# Patient Record
Sex: Female | Born: 1935 | Race: White | Hispanic: No | Marital: Married | Smoking: Never smoker
Health system: Southern US, Community
[De-identification: ages and names within clinical notes are randomized; demographics above are authoritative.]

## PROBLEM LIST (undated history)

## (undated) DIAGNOSIS — E079 Disorder of thyroid, unspecified: Secondary | ICD-10-CM

## (undated) DIAGNOSIS — E785 Hyperlipidemia, unspecified: Secondary | ICD-10-CM

## (undated) DIAGNOSIS — I10 Essential (primary) hypertension: Secondary | ICD-10-CM

## (undated) DIAGNOSIS — K219 Gastro-esophageal reflux disease without esophagitis: Secondary | ICD-10-CM

## (undated) DIAGNOSIS — G2581 Restless legs syndrome: Secondary | ICD-10-CM

## (undated) DIAGNOSIS — E876 Hypokalemia: Secondary | ICD-10-CM

## (undated) HISTORY — PX: ABDOMINAL HYSTERECTOMY: SHX81

## (undated) HISTORY — PX: ANKLE FRACTURE SURGERY: SHX122

## (undated) HISTORY — PX: CHOLECYSTECTOMY: SHX55

## (undated) HISTORY — PX: APPENDECTOMY (OPEN): SHX54

## (undated) HISTORY — PX: OTHER SURGICAL HISTORY: SHX169

## (undated) HISTORY — DX: Disorder of thyroid, unspecified: E07.9

## (undated) HISTORY — PX: ANKLE SURGERY: SHX546

## (undated) HISTORY — PX: SALPINGO OOPHORECTOMY: SHX510126

## (undated) HISTORY — DX: Essential (primary) hypertension: I10

## (undated) HISTORY — PX: HYSTERECTOMY: SHX81

## (undated) HISTORY — DX: Hyperlipidemia, unspecified: E78.5

## (undated) HISTORY — PX: CORRECTION HAMMER TOE: SUR317

## (undated) HISTORY — PX: JOINT REPLACEMENT: SHX530

---

## 2006-05-03 DIAGNOSIS — M19019 Primary osteoarthritis, unspecified shoulder: Secondary | ICD-10-CM | POA: Insufficient documentation

## 2007-05-31 ENCOUNTER — Ambulatory Visit
Admission: RE | Admit: 2007-05-31 | Disposition: A | Discharge status: Home / Self Care | Payer: Self-pay | Source: Ambulatory Visit

## 2013-01-08 ENCOUNTER — Other Ambulatory Visit: Payer: Self-pay | Admitting: Family Medicine

## 2013-01-08 ENCOUNTER — Ambulatory Visit
Admission: RE | Admit: 2013-01-08 | Discharge: 2013-01-08 | Disposition: A | Discharge status: Home / Self Care | Payer: Medicare Other | Source: Ambulatory Visit | Attending: Family Medicine | Admitting: Family Medicine

## 2013-01-08 DIAGNOSIS — M79609 Pain in unspecified limb: Secondary | ICD-10-CM | POA: Insufficient documentation

## 2013-04-17 ENCOUNTER — Other Ambulatory Visit (INDEPENDENT_AMBULATORY_CARE_PROVIDER_SITE_OTHER): Payer: Self-pay | Admitting: Family

## 2013-04-23 ENCOUNTER — Other Ambulatory Visit: Payer: Self-pay | Admitting: Internal Medicine

## 2013-04-24 ENCOUNTER — Other Ambulatory Visit: Payer: Self-pay | Admitting: Internal Medicine

## 2013-06-17 ENCOUNTER — Other Ambulatory Visit: Payer: Self-pay | Admitting: Internal Medicine

## 2013-07-11 ENCOUNTER — Other Ambulatory Visit (INDEPENDENT_AMBULATORY_CARE_PROVIDER_SITE_OTHER): Payer: Self-pay | Admitting: Family

## 2013-07-11 MED ORDER — CLONAZEPAM 1 MG PO TABS
1.0000 mg | ORAL_TABLET | Freq: Every evening | ORAL | Status: DC | PRN
Start: 2013-07-11 — End: 2013-12-29

## 2013-07-11 NOTE — Telephone Encounter (Signed)
Patient called and her mail away RX for Clonazepam will not be here until sometime next week.  She was prescribed this by Elnita Maxwell and Dr. Amada Jupiter for restless leg syndrome.  Since she is out and her mail order is late she is asking for an rx to be written and she will pick up.  Clonazepam 1 mgs.  She is also requesting that someone call her when this is ready.

## 2013-07-11 NOTE — Telephone Encounter (Signed)
Pt picked up Rx.. pkp

## 2013-07-11 NOTE — Telephone Encounter (Signed)
See below and order rx and reply to pool//jlm

## 2013-07-11 NOTE — Telephone Encounter (Signed)
RX ready

## 2013-08-11 ENCOUNTER — Other Ambulatory Visit (INDEPENDENT_AMBULATORY_CARE_PROVIDER_SITE_OTHER): Payer: Self-pay | Admitting: Family

## 2013-08-22 ENCOUNTER — Other Ambulatory Visit (INDEPENDENT_AMBULATORY_CARE_PROVIDER_SITE_OTHER): Payer: Self-pay | Admitting: Family

## 2013-10-17 ENCOUNTER — Other Ambulatory Visit (INDEPENDENT_AMBULATORY_CARE_PROVIDER_SITE_OTHER): Payer: Self-pay

## 2013-10-20 MED ORDER — VENLAFAXINE HCL ER 75 MG PO CP24
ORAL_CAPSULE | ORAL | Status: DC
Start: 2013-10-20 — End: 2014-09-02

## 2013-10-29 ENCOUNTER — Ambulatory Visit (INDEPENDENT_AMBULATORY_CARE_PROVIDER_SITE_OTHER): Payer: Medicare Other | Admitting: Family

## 2013-10-29 ENCOUNTER — Other Ambulatory Visit
Admission: RE | Admit: 2013-10-29 | Discharge: 2013-10-29 | Disposition: A | Discharge status: Home / Self Care | Payer: PRIVATE HEALTH INSURANCE | Source: Ambulatory Visit | Attending: Family | Admitting: Family

## 2013-10-29 ENCOUNTER — Encounter (INDEPENDENT_AMBULATORY_CARE_PROVIDER_SITE_OTHER): Payer: Self-pay | Admitting: Family

## 2013-10-29 VITALS — BP 131/71 | HR 85 | Temp 98.7°F | Ht 63.5 in | Wt 164.6 lb

## 2013-10-29 DIAGNOSIS — E559 Vitamin D deficiency, unspecified: Secondary | ICD-10-CM

## 2013-10-29 DIAGNOSIS — E039 Hypothyroidism, unspecified: Secondary | ICD-10-CM

## 2013-10-29 DIAGNOSIS — IMO0002 Reserved for concepts with insufficient information to code with codable children: Secondary | ICD-10-CM

## 2013-10-29 DIAGNOSIS — F3340 Major depressive disorder, recurrent, in remission, unspecified: Secondary | ICD-10-CM | POA: Insufficient documentation

## 2013-10-29 DIAGNOSIS — B351 Tinea unguium: Secondary | ICD-10-CM

## 2013-10-29 DIAGNOSIS — E785 Hyperlipidemia, unspecified: Secondary | ICD-10-CM

## 2013-10-29 DIAGNOSIS — L309 Dermatitis, unspecified: Secondary | ICD-10-CM

## 2013-10-29 DIAGNOSIS — L259 Unspecified contact dermatitis, unspecified cause: Secondary | ICD-10-CM

## 2013-10-29 LAB — LIPID PANEL
Cholesterol: 157 mg/dL (ref 75–199)
Coronary Heart Disease Risk: 3.93
HDL: 40 mg/dL — ABNORMAL LOW (ref 45–65)
LDL Calculated: 97 mg/dL
Triglycerides: 102 mg/dL (ref 10–150)
VLDL: 20 (ref 0–40)

## 2013-10-29 LAB — VITAMIN D,25 OH,TOTAL: Vitamin D 25-Hydroxy: 35 ng/mL (ref 30–80)

## 2013-10-29 LAB — CBC AND DIFFERENTIAL
Basophils %: 1 % (ref 0.0–3.0)
Basophils Absolute: 0.1 10*3/uL (ref 0.0–0.3)
Eosinophils %: 2.8 % (ref 0.0–7.0)
Eosinophils Absolute: 0.2 10*3/uL (ref 0.0–0.8)
Hematocrit: 37.9 % (ref 36.0–48.0)
Hemoglobin: 12.7 gm/dL (ref 12.0–16.0)
Lymphocytes Absolute: 1.1 10*3/uL (ref 0.6–5.1)
Lymphocytes: 20.1 % (ref 15.0–46.0)
MCH: 30 pg (ref 28–35)
MCHC: 34 gm/dL (ref 32–36)
MCV: 90 fL (ref 80–100)
MPV: 7.3 fL (ref 6.0–10.0)
Monocytes Absolute: 0.4 10*3/uL (ref 0.1–1.7)
Monocytes: 7.6 % (ref 3.0–15.0)
Neutrophils %: 68.5 % (ref 42.0–78.0)
Neutrophils Absolute: 3.8 10*3/uL (ref 1.7–8.6)
PLT CT: 299 10*3/uL (ref 130–440)
RBC: 4.22 10*6/uL (ref 3.80–5.00)
RDW: 12.5 % (ref 11.0–14.0)
WBC: 5.5 10*3/uL (ref 4.0–11.0)

## 2013-10-29 LAB — COMPREHENSIVE METABOLIC PANEL
ALT: 16 U/L (ref 0–55)
AST (SGOT): 23 U/L (ref 10–42)
Albumin/Globulin Ratio: 1.25 Ratio (ref 0.70–1.50)
Albumin: 4 gm/dL (ref 3.5–5.0)
Alkaline Phosphatase: 43 U/L (ref 40–145)
Anion Gap: 16.3 mMol/L (ref 7.0–18.0)
BUN / Creatinine Ratio: 20 Ratio (ref 10.0–30.0)
BUN: 22 mg/dL (ref 7–22)
Bilirubin, Total: 0.3 mg/dL (ref 0.1–1.2)
CO2: 23.6 mMol/L (ref 20.0–30.0)
Calcium: 9.8 mg/dL (ref 8.5–10.5)
Chloride: 105 mMol/L (ref 98–110)
Creatinine: 1.1 mg/dL (ref 0.60–1.20)
EGFR: 48 mL/min/{1.73_m2}
Globulin: 3.2 gm/dL (ref 2.0–4.0)
Glucose: 96 mg/dL (ref 70–99)
Osmolality Calc: 284 mOsm/kg (ref 275–300)
Potassium: 3.9 mMol/L (ref 3.5–5.3)
Protein, Total: 7.2 gm/dL (ref 6.0–8.3)
Sodium: 141 mMol/L (ref 136–147)

## 2013-10-29 LAB — T3, FREE: T3, Free: 2.6 pg/mL (ref 1.71–3.71)

## 2013-10-29 LAB — TSH: TSH: 0.88 u[IU]/mL (ref 0.40–4.20)

## 2013-10-29 LAB — T4, FREE: T4 Free: 1.22 ng/dL (ref 0.70–1.48)

## 2013-10-29 MED ORDER — TERBINAFINE HCL 250 MG PO TABS
250.0000 mg | ORAL_TABLET | Freq: Every day | ORAL | Status: DC
Start: 2013-10-29 — End: 2014-07-02

## 2013-10-29 MED ORDER — PREDNISONE 20 MG PO TABS
ORAL_TABLET | ORAL | Status: DC
Start: 2013-10-29 — End: 2014-07-02

## 2013-10-29 NOTE — Progress Notes (Signed)
Subjective:    Patient ID: Cassandra [name] is a 78 y.o. female.    Rash  This is a new problem. The current episode started in the past 7 days. The problem has been gradually worsening since onset. The affected locations include the left arm and right arm. The rash is characterized by itchiness, blistering and redness. She was exposed to plant contact. Past treatments include topical steroids. The treatment provided no relief.     Complains of a thickening of the toenail on the left third toe, first noticed about 4 months ago. No other toenails are affected.      Review of Systems   Constitutional: Negative.    Respiratory: Negative.    Cardiovascular: Negative.    Endocrine: Negative.    Skin: Positive for rash.   Psychiatric/Behavioral: Negative.            Objective:    Physical Exam   Constitutional: She is oriented to person, place, and time. She appears well-developed and well-nourished.   HENT:   Head: Normocephalic and atraumatic.   Eyes: Pupils are equal, round, and reactive to light.   Neck: Normal range of motion. Neck supple. No thyromegaly present.   Cardiovascular: Normal rate.  Exam reveals no friction rub.    No murmur heard.  A subtle regularly irregular rhythm is noted.   Pulmonary/Chest: Effort normal and breath sounds normal. No respiratory distress.   Musculoskeletal: Normal range of motion.   Lymphadenopathy:     She has no cervical adenopathy.   Neurological: She is alert and oriented to person, place, and time.   Skin: Skin is dry. Rash noted.   A vesicular erythematous rash in a linear formation noted on the right arm with isolated erythematous vesicles noted on the left forearm as well. This is consistent with poison ivy exposure.    A significantly thickened discolored toenail is noted on the left third toe. No other toenails are affected bilaterally.   Psychiatric: She has a normal mood and affect.   Vitals reviewed.          Assessment:       1. Dermatitis     2. Unspecified  hypothyroidism  TSH    T4, free    T3, free   3. Other and unspecified hyperlipidemia  CBC and differential    Comprehensive metabolic panel    Lipid panel   4. Unspecified vitamin D deficiency  Vitamin D,25 OH, Total   5. Onychomycosis of toenail     6. Major depressive disorder, recurrent, in remission               Plan:       Fasting blood work today will include a CBC, CMP, lipid panel, TSH/free T3/free T4, vitamin D 25-hydroxy.    Rx: Prednisone 20 mg tapering from 60 mg down to 10 mg over an 8 day period. Potential side effects discussed.    Rx: Lamisil 250 mg daily.    Return for repeat CMP in 1 month.

## 2013-11-03 ENCOUNTER — Other Ambulatory Visit (INDEPENDENT_AMBULATORY_CARE_PROVIDER_SITE_OTHER): Payer: Self-pay | Admitting: Family

## 2013-11-11 ENCOUNTER — Telehealth (INDEPENDENT_AMBULATORY_CARE_PROVIDER_SITE_OTHER): Payer: Self-pay

## 2013-11-11 NOTE — Telephone Encounter (Signed)
L/M to return call for results.//bc

## 2013-11-11 NOTE — Telephone Encounter (Signed)
-----   Message from Zenia Resides, FNP sent at 11/07/2013  8:52 AM EDT -----  Please notify: Liver function is good, cholesterol is good, thyroid functions within normal limits, vitamin D is lower than desired. I recommend vitamin D3 1000 IUs daily.

## 2013-11-12 NOTE — Telephone Encounter (Signed)
Results to patient .//bc

## 2013-11-24 ENCOUNTER — Other Ambulatory Visit (INDEPENDENT_AMBULATORY_CARE_PROVIDER_SITE_OTHER): Payer: Self-pay | Admitting: Family

## 2013-12-29 ENCOUNTER — Other Ambulatory Visit (INDEPENDENT_AMBULATORY_CARE_PROVIDER_SITE_OTHER): Payer: Self-pay | Admitting: Family

## 2013-12-29 MED ORDER — CLONAZEPAM 1 MG PO TABS
1.0000 mg | ORAL_TABLET | Freq: Every evening | ORAL | Status: DC | PRN
Start: 2013-12-29 — End: 2014-04-09

## 2014-01-05 ENCOUNTER — Encounter (INDEPENDENT_AMBULATORY_CARE_PROVIDER_SITE_OTHER): Payer: Self-pay

## 2014-01-05 NOTE — Progress Notes (Signed)
Immunization update from Pharmacy

## 2014-01-19 ENCOUNTER — Encounter (INDEPENDENT_AMBULATORY_CARE_PROVIDER_SITE_OTHER): Payer: Self-pay

## 2014-02-28 ENCOUNTER — Other Ambulatory Visit (INDEPENDENT_AMBULATORY_CARE_PROVIDER_SITE_OTHER): Payer: Self-pay | Admitting: Internal Medicine

## 2014-03-01 ENCOUNTER — Other Ambulatory Visit (INDEPENDENT_AMBULATORY_CARE_PROVIDER_SITE_OTHER): Payer: Self-pay | Admitting: Family

## 2014-03-30 ENCOUNTER — Other Ambulatory Visit (INDEPENDENT_AMBULATORY_CARE_PROVIDER_SITE_OTHER): Payer: Self-pay | Admitting: Family

## 2014-04-09 ENCOUNTER — Other Ambulatory Visit (RURAL_HEALTH_CENTER): Payer: Self-pay | Admitting: Family

## 2014-04-09 ENCOUNTER — Telehealth (RURAL_HEALTH_CENTER): Payer: Self-pay | Admitting: Family

## 2014-04-09 MED ORDER — CLONAZEPAM 1 MG PO TABS
1.0000 mg | ORAL_TABLET | Freq: Every evening | ORAL | Status: DC | PRN
Start: 2014-04-09 — End: 2014-04-10

## 2014-04-09 NOTE — Telephone Encounter (Signed)
Pt needs a call back about refills//jlm

## 2014-04-09 NOTE — Telephone Encounter (Signed)
Pt needs enough until mail order arrives

## 2014-04-09 NOTE — Telephone Encounter (Signed)
Pt advised Clonazepam has a refill and that she should contact her pharmacy. Pt advised that she should be seen befor further refills of clonazepam are needed//jlm

## 2014-04-10 ENCOUNTER — Other Ambulatory Visit (RURAL_HEALTH_CENTER): Payer: Self-pay | Admitting: Family

## 2014-04-13 ENCOUNTER — Telehealth (RURAL_HEALTH_CENTER): Payer: Self-pay

## 2014-04-13 MED ORDER — CLONAZEPAM 1 MG PO TABS
1.0000 mg | ORAL_TABLET | Freq: Every evening | ORAL | Status: DC | PRN
Start: 2014-04-13 — End: 2014-07-02

## 2014-04-13 NOTE — Telephone Encounter (Signed)
[facility] left message about a refill for this patient on Clonazepam 1 mg.  It can be called into [phone] or faxed to [phone] of sent in  electronically.

## 2014-04-13 NOTE — Telephone Encounter (Signed)
Request in Cheryl's box. This encounter is closed//jlm

## 2014-04-14 ENCOUNTER — Telehealth (RURAL_HEALTH_CENTER): Payer: Self-pay

## 2014-04-14 NOTE — Telephone Encounter (Signed)
Pt's questions answered R/T klonopin.

## 2014-04-14 NOTE — Telephone Encounter (Signed)
Patient called in and stated that she wants to know if her prescription from the 20th of December can be 90 days at a local pharmacy because [facility] is giving her a headache.  [facility] told her the doctor needs to call [phone] by the 20th of January or she can not get it filled.

## 2014-04-14 NOTE — Telephone Encounter (Signed)
Call pt for more info

## 2014-05-14 ENCOUNTER — Other Ambulatory Visit (RURAL_HEALTH_CENTER): Payer: Self-pay | Admitting: Family

## 2014-05-18 ENCOUNTER — Other Ambulatory Visit (INDEPENDENT_AMBULATORY_CARE_PROVIDER_SITE_OTHER): Payer: Self-pay

## 2014-05-25 ENCOUNTER — Other Ambulatory Visit (RURAL_HEALTH_CENTER): Payer: Self-pay | Admitting: Family

## 2014-05-25 NOTE — Telephone Encounter (Signed)
L/M to have the Pt call the office. Cassandra [name] is requesting a refill of Lamisil 250 mg which had enough refills for 6 months.  Pt advised that if she needs to continue this med that she will need an appt. Pt voiced understanding//jlm

## 2014-06-18 ENCOUNTER — Other Ambulatory Visit (RURAL_HEALTH_CENTER): Payer: Self-pay | Admitting: Family Medicine

## 2014-06-19 MED ORDER — PANTOPRAZOLE SODIUM 40 MG PO TBEC
DELAYED_RELEASE_TABLET | ORAL | Status: DC
Start: 2014-06-19 — End: 2014-11-24

## 2014-07-02 ENCOUNTER — Other Ambulatory Visit
Admission: RE | Admit: 2014-07-02 | Discharge: 2014-07-02 | Disposition: A | Discharge status: Home / Self Care | Payer: PRIVATE HEALTH INSURANCE | Source: Ambulatory Visit | Attending: Family | Admitting: Family

## 2014-07-02 ENCOUNTER — Ambulatory Visit (RURAL_HEALTH_CENTER): Payer: PRIVATE HEALTH INSURANCE | Admitting: Family

## 2014-07-02 ENCOUNTER — Encounter (RURAL_HEALTH_CENTER): Payer: Self-pay | Admitting: Family

## 2014-07-02 ENCOUNTER — Ambulatory Visit (RURAL_HEALTH_CENTER): Payer: Medicare Other | Admitting: Family

## 2014-07-02 VITALS — BP 113/67 | HR 81 | Temp 97.6°F | Resp 16 | Ht 63.5 in | Wt 166.0 lb

## 2014-07-02 DIAGNOSIS — E782 Mixed hyperlipidemia: Secondary | ICD-10-CM

## 2014-07-02 DIAGNOSIS — E039 Hypothyroidism, unspecified: Secondary | ICD-10-CM

## 2014-07-02 DIAGNOSIS — I10 Essential (primary) hypertension: Secondary | ICD-10-CM

## 2014-07-02 LAB — COMPREHENSIVE METABOLIC PANEL
ALT: 17 U/L (ref 0–55)
AST (SGOT): 22 U/L (ref 10–42)
Albumin/Globulin Ratio: 1.33 Ratio (ref 0.70–1.50)
Albumin: 4 gm/dL (ref 3.5–5.0)
Alkaline Phosphatase: 42 U/L (ref 40–145)
Anion Gap: 17.2 mMol/L (ref 7.0–18.0)
BUN / Creatinine Ratio: 25.2 Ratio (ref 10.0–30.0)
BUN: 34 mg/dL — ABNORMAL HIGH (ref 7–22)
Bilirubin, Total: 0.3 mg/dL (ref 0.1–1.2)
CO2: 21.9 mMol/L (ref 20.0–30.0)
Calcium: 9.9 mg/dL (ref 8.5–10.5)
Chloride: 106 mMol/L (ref 98–110)
Creatinine: 1.35 mg/dL — ABNORMAL HIGH (ref 0.60–1.20)
EGFR: 38 mL/min/{1.73_m2}
Globulin: 3 gm/dL (ref 2.0–4.0)
Glucose: 108 mg/dL — ABNORMAL HIGH (ref 70–99)
Osmolality Calc: 289 mOsm/kg (ref 275–300)
Potassium: 4.1 mMol/L (ref 3.5–5.3)
Protein, Total: 7 gm/dL (ref 6.0–8.3)
Sodium: 141 mMol/L (ref 136–147)

## 2014-07-02 LAB — CBC AND DIFFERENTIAL
Basophils %: 0.3 % (ref 0.0–3.0)
Basophils Absolute: 0 10*3/uL (ref 0.0–0.3)
Eosinophils %: 2.6 % (ref 0.0–7.0)
Eosinophils Absolute: 0.2 10*3/uL (ref 0.0–0.8)
Hematocrit: 38.6 % (ref 36.0–48.0)
Hemoglobin: 12.8 gm/dL (ref 12.0–16.0)
Lymphocytes Absolute: 1.2 10*3/uL (ref 0.6–5.1)
Lymphocytes: 20.7 % (ref 15.0–46.0)
MCH: 30 pg (ref 28–35)
MCHC: 33 gm/dL (ref 32–36)
MCV: 91 fL (ref 80–100)
MPV: 7.5 fL (ref 6.0–10.0)
Monocytes Absolute: 0.5 10*3/uL (ref 0.1–1.7)
Monocytes: 9.2 % (ref 3.0–15.0)
Neutrophils %: 67.3 % (ref 42.0–78.0)
Neutrophils Absolute: 3.9 10*3/uL (ref 1.7–8.6)
PLT CT: 347 10*3/uL (ref 130–440)
RBC: 4.23 10*6/uL (ref 3.80–5.00)
RDW: 11.9 % (ref 11.0–14.0)
WBC: 5.8 10*3/uL (ref 4.0–11.0)

## 2014-07-02 LAB — LIPID PANEL
Cholesterol: 162 mg/dL (ref 75–199)
Coronary Heart Disease Risk: 5.4
HDL: 30 mg/dL — ABNORMAL LOW (ref 45–65)
LDL Calculated: 99 mg/dL
Triglycerides: 163 mg/dL — ABNORMAL HIGH (ref 10–150)
VLDL: 33 (ref 0–40)

## 2014-07-02 LAB — THYROID STIMULATING HORMONE (TSH), REFLEX ON ABNORMAL TO FREE T4, SERUM: TSH: 2.18 u[IU]/mL (ref 0.40–4.20)

## 2014-07-02 MED ORDER — CLONAZEPAM 1 MG PO TABS
1.0000 mg | ORAL_TABLET | Freq: Every evening | ORAL | Status: DC | PRN
Start: 2014-07-02 — End: 2015-02-02

## 2014-07-02 NOTE — Progress Notes (Signed)
Subjective:    Patient ID: Cassandra [name] is a 79 y.o. female   Chief Complaint   Patient presents with   . Hypothyroidism     refill Meds. Did not bring meds     HPI      This patient has not been seen for regular appointment in at least a year. She is overdue for labs.     She reports that she was started Lamisil 3 months ago for fungus in her left toenails. A nail clipping was not taken. She has history of left foot surgery by Dr. Freida Busman.     The patient denies any new symptoms or issues except for vomiting after drinking expires mils product. The patient denies any chest pain, chest tightness, dizziness, light head-ness, fever, chills, weakness, slurred speech, numbness, tingling, melena, constipation, SOB, pain, nausea, and vomiting.     She is currently compliant with medications and diet for HTN control. BP in the office today is 113/67. Patient denies chest pain, chest pressure/discomfort, claudication, dyspnea, exertional chest pressure/discomfort, fatigue, irregular heart beat, lower extremity edema, near-syncope, orthopnea, palpitations, paroxysmal nocturnal dyspnea, syncope and tachypnea.        The patient currently takes Effexor XR 75 mg daily for depression/anxiety. She also takes Klonopin to help with incomnia. She believes that she is doing well with the Effexor. She is able to sleep well with Klonopin. She denies anxiety, behavioral disorder, depression, disorientation, hallucinations, hostility, irritability, memory difficulties, mood swings, obsessive thoughts, physical abuse, sexual abuse, insomnia, decrease in interests, feeling of guilt, lack of self esteem, decrease in energy, decrease in concentration, change in appetite, crying spells, headaches, or change in libido.      Patient is currently taking Protonix 40 mg daily for relief of GERD symptoms. She denies abdominal bloating, belching, bilious reflux, chest pain, choking on food, cough, deep pressure at base of neck, difficulty  swallowing, dysphagia, fullness after meals, heartburn, hematemesis, hoarseness, laryngitis, melena, nausea, need to clear throat frequently, nocturnal burning, odynophagia, regurgitation of undigested food, shortness of breath, symptoms primarily relate to meals, and lying down after meals, upper abdominal discomfort and wheezing.        The patient takes Levothyroxine 112 mcg daily. She denies changes in energy level, diarrhea, heat/cold intolerance, nervousness, palpitations and weight changes.       Patient is compliant with medication and diet for hyperlipidemia. She is compliant with Mevacor 20 mg daily.      The following portions of the patient's history were reviewed and updated as appropriate: allergies, current medications, past family history, past medical history, past social history, past surgical history and problem list.    Review of Systems   Constitutional: Negative for fever, chills, appetite change and fatigue.   HENT: Negative for congestion, ear discharge, ear pain, postnasal drip, rhinorrhea, sinus pressure, sore throat and trouble swallowing.    Eyes: Negative for pain, discharge, redness and visual disturbance.   Respiratory: Negative for cough, chest tightness, shortness of breath and wheezing.    Cardiovascular: Negative for chest pain, palpitations and leg swelling.   Gastrointestinal: Negative for nausea, vomiting, abdominal pain, diarrhea and constipation.   Genitourinary: Negative for dysuria, urgency, frequency, hematuria, flank pain, decreased urine volume and difficulty urinating.   Musculoskeletal: Positive for myalgias.   Skin: Negative for rash and wound.   Neurological: Negative for dizziness, facial asymmetry, speech difficulty, weakness, light-headedness, numbness and headaches.   Hematological: Negative for adenopathy.  Objective:    Physical Exam   Constitutional: She is oriented to person, place, and time. She appears well-developed and well-nourished.   HENT:    Head: Normocephalic and atraumatic.   Right Ear: Tympanic membrane and ear canal normal.   Left Ear: Tympanic membrane and ear canal normal.   Nose: Nose normal.   Mouth/Throat: Uvula is midline and oropharynx is clear and moist. No oropharyngeal exudate or posterior oropharyngeal erythema.   Eyes: Conjunctivae and EOM are normal. Pupils are equal, round, and reactive to light. Right eye exhibits no discharge. Left eye exhibits no discharge.   Neck: Normal range of motion. Neck supple. No thyromegaly present.   Cardiovascular: Normal rate, regular rhythm, normal heart sounds and intact distal pulses.  Exam reveals no gallop and no friction rub.    No murmur heard.  Pulmonary/Chest: Effort normal and breath sounds normal. No respiratory distress. She has no wheezes. She has no rales.   Abdominal: Soft. Bowel sounds are normal. She exhibits no distension and no mass. There is no tenderness.   Musculoskeletal: Normal range of motion. She exhibits no edema or tenderness.   Lymphadenopathy:     She has no cervical adenopathy.   Neurological: She is alert and oriented to person, place, and time. She exhibits normal muscle tone.   Skin: Skin is warm and dry. No rash noted. No erythema.   Fungus in the 3rd toe in the left foot   Vitals reviewed.          Assessment:       1. Mixed hyperlipidemia    2. Essential hypertension    3. Hypothyroidism, unspecified hypothyroidism type          Plan:         Continue Hyzaar as prescribe for hypertension.  Continue Levothyroxine 112 mcg daily for her thyroid.  Continue Klonopin for insomnia.  Continue Mevacor for HLD as prescribed until told otherwise.  Continue Effexor for depression.  Continue Protonix as prescribed for GERD.  Continue Potassium and Feno-fibric acid as prescribed.  Discontinue Garcinia Cambogia and Collagen.  Advised patient to continue to take Voltaren as needed for arthritis.  Advised patient to file the nail on the 3rd digit of the left foot back and paint the  nail.  Order a CMP, CBC, Lipid, and TSH.    RTO in 3 months.        Scribed for Dr. Beverely Pace, DNP by Thomes Dinning, Medical Scribe.  07/02/2014 11:32    I, Dr. Beverely Pace, personally performed the services described in this documentation, as scribed by, Regan Lemming, in my presence. I agree with this documentation and It is both accurate and complete.

## 2014-07-03 ENCOUNTER — Telehealth (RURAL_HEALTH_CENTER): Payer: Self-pay | Admitting: Family

## 2014-07-03 NOTE — Telephone Encounter (Signed)
Results and directions to pt... pkp

## 2014-07-03 NOTE — Telephone Encounter (Signed)
L/M to return call//jlm

## 2014-07-03 NOTE — Telephone Encounter (Signed)
-----   Message from Beverely Pace, [place] sent at 07/03/2014  9:27 AM EDT -----  Change your eating habits! Lipid profile abnormal.

## 2014-07-08 ENCOUNTER — Other Ambulatory Visit (INDEPENDENT_AMBULATORY_CARE_PROVIDER_SITE_OTHER): Payer: Self-pay | Admitting: Family

## 2014-07-20 ENCOUNTER — Other Ambulatory Visit (INDEPENDENT_AMBULATORY_CARE_PROVIDER_SITE_OTHER): Payer: Self-pay | Admitting: Internal Medicine

## 2014-09-02 ENCOUNTER — Other Ambulatory Visit (INDEPENDENT_AMBULATORY_CARE_PROVIDER_SITE_OTHER): Payer: Self-pay | Admitting: Family

## 2014-10-29 ENCOUNTER — Ambulatory Visit (RURAL_HEALTH_CENTER): Payer: Medicare Other | Admitting: Family

## 2014-10-29 ENCOUNTER — Encounter (RURAL_HEALTH_CENTER): Payer: Self-pay | Admitting: Family

## 2014-10-29 VITALS — BP 103/67 | HR 83 | Temp 98.1°F | Ht 63.5 in | Wt 166.8 lb

## 2014-10-29 DIAGNOSIS — E782 Mixed hyperlipidemia: Secondary | ICD-10-CM

## 2014-10-29 DIAGNOSIS — I10 Essential (primary) hypertension: Secondary | ICD-10-CM

## 2014-10-29 DIAGNOSIS — E039 Hypothyroidism, unspecified: Secondary | ICD-10-CM

## 2014-10-29 DIAGNOSIS — B351 Tinea unguium: Secondary | ICD-10-CM

## 2014-10-29 HISTORY — DX: Essential (primary) hypertension: I10

## 2014-10-29 HISTORY — DX: Tinea unguium: B35.1

## 2014-10-29 NOTE — Progress Notes (Signed)
PROGRESS NOTE    Patient Name: Cassandra [name],Cassandra [name]    Subjective  History of Present Illness:   Cassandra [name] is a 79 y.o. female who presents with:  Chief Complaint   Patient presents with   . Hyperlipidemia     3 month F/U   . Hypertension   . Hypothyroidism        Cassandra [name] is a 79 YO female who presents with hypertension. She is here today for a medical management follow up.  She is doing well with no new complaints. Pt is post left knee replacement and is doing well. She is concerned with her nails on her left foot and would like to know if the OTC medications would help.    She is currently Hyzaar 50-12.5 MG taking  for HTN control. BP in the office today is 103/67. Patient denies chest pain, chest pressure/discomfort, claudication, dyspnea, exertional chest pressure/discomfort, fatigue, irregular heart beat, lower extremity edema, near-syncope, orthopnea, palpitations, paroxysmal nocturnal dyspnea, syncope and tachypnea.     Patient is compliant with medication and diet with hyperlipidemia. Last labs showed a total cholesterol of 163 and a HDL of 30 on 07/02/14.    The following portions of the patient's history were reviewed and updated as appropriate: allergies, current medications, past family history, past medical history, past social history, past surgical history and problem list.  Problem List:     Patient Active Problem List   Diagnosis   . Other and unspecified hyperlipidemia   . Major depressive disorder, recurrent, in remission   . Unspecified hypothyroidism   . Osteoarthritis of shoulder   . Essential hypertension   . Onychomycosis of toenail          Medications:     Prior to Admission medications    Medication Sig Start Date End Date Taking? Authorizing Provider   Choline Fenofibrate (FENOFIBRIC ACID) 135 MG Capsule Delayed Release TAKE 1 CAPSULE DAILY 03/01/14   Beverely Pace, FNP   clonazePAM (KLONOPIN) 1 MG tablet Take 1 tablet (1 mg total) by mouth nightly as needed for Anxiety. 07/02/14    Beverely Pace, FNP   COLLAGEN PO Take by mouth.    [provider]   diclofenac (VOLTAREN) 75 MG EC tablet Take 75 mg by mouth 2 (two) times daily.    05/14/14   [provider]   GARCINIA CAMBOGIA-CHROMIUM PO Take by mouth.    [provider]   levothyroxine (SYNTHROID, LEVOTHROID) 112 MCG tablet TAKE ONE TABLET BY MOUTH ONCE DAILY 07/20/14   Graciela Husbands, DO   losartan-hydrochlorothiazide [facility]) 50-12.5 MG per tablet TAKE 1 TABLET DAILY 03/30/14   Beverely Pace, FNP   lovastatin (MEVACOR) 20 MG tablet TAKE ONE TABLET BY MOUTH ONCE DAILY FOR CHOLESTEROL 03/01/14   Graciela Husbands, DO   pantoprazole (PROTONIX) 40 MG tablet TAKE ONE TABLET BY MOUTH ONCE DAILY 06/19/14   Graciela Husbands, DO   potassium chloride (K-DUR,KLOR-CON) 20 MEQ tablet TAKE 1 TABLET DAILY 07/09/14   Beverely Pace, FNP   venlafaxine (EFFEXOR-XR) 75 MG 24 hr capsule TAKE 1 CAPSULE DAILY 09/03/14   Beverely Pace, FNP        Review of Systems:   Review of Systems   Constitutional: Negative.    HENT: Negative.    Musculoskeletal: Positive for arthralgias.   Skin:        Fungus on third toe of left foot    Psychiatric/Behavioral: Negative.          Physical  Exam:      Filed Vitals:    10/29/14 1309   BP: 103/67   Pulse: 83   Temp: 98.1 F (36.7 C)   SpO2: 98%     Physical Exam   Constitutional: She is oriented to person, place, and time. She appears well-developed and well-nourished.   Neck: Normal range of motion. Neck supple. Carotid bruit is not present.   Cardiovascular: Normal rate, regular rhythm, S1 normal, S2 normal, normal heart sounds, intact distal pulses and normal pulses.  Exam reveals no gallop and no friction rub.    No murmur heard.  Pulmonary/Chest: Effort normal and breath sounds normal.   Abdominal: Soft. Bowel sounds are normal.   Musculoskeletal: Normal range of motion. She exhibits no edema.   Lymphadenopathy:     She has no cervical adenopathy.   Neurological: She is alert and oriented to person, place, and  time.   Skin: Skin is warm and dry.   Psychiatric: She has a normal mood and affect. Her behavior is normal. Judgment and thought content normal.     Assessment:      Encounter Diagnoses   Name Primary?   . Essential hypertension Yes   . Onychomycosis of toenail    . Hypothyroidism, unspecified hypothyroidism type    . Mixed hyperlipidemia      Plan:     Return in about 3 months (around 01/29/2015).  Reviewed previous blood work  Continue current medications   Reviewed Health Maintenance     Scribed for Dr. Beverely Pace, DNP by Dorthula Nettles, Medical Scribe.  10/29/2014      I, Dr. Beverely Pace, personally performed the services described in this documentation, as scribed by, Dorthula Nettles, in my presence. I agree with this documentation and It is both accurate and complete.

## 2014-10-29 NOTE — Progress Notes (Signed)
PROGRESS NOTE    Patient Name: Cassandra [name],Cassandra [name]    Subjective  History of Present Illness:   Cassandra [name] is a 79 y.o. female who presents with:  Chief Complaint   Patient presents with   . Hyperlipidemia     3 month F/U   . Hypertension   . Hypothyroidism        Cassandra [name] is a 79 YO female who presents with hypertension. She is here today for a medical management follow up.  She is doing well with no new complaints. Pt is post left knee replacement and is doing well. She is concerned with her nails on her left foot and would like to know if the OTC medications would help. She has a skin tag that has reappeared on her chest she would like to have looked at as well.   She is currently Hyzaar 50-12.5 MG taking  for HTN control. BP in the office today is 103/67. Patient denies chest pain, chest pressure/discomfort, claudication, dyspnea, exertional chest pressure/discomfort, fatigue, irregular heart beat, lower extremity edema, near-syncope, orthopnea, palpitations, paroxysmal nocturnal dyspnea, syncope and tachypnea.     Patient is compliant with medication and diet with hyperlipidemia. Last labs showed a total cholesterol of 163 and a HDL of 30 on 07/02/14.    The following portions of the patient's history were reviewed and updated as appropriate: allergies, current medications, past family history, past medical history, past social history, past surgical history and problem list.  Problem List:     Patient Active Problem List   Diagnosis   . Other and unspecified hyperlipidemia   . Major depressive disorder, recurrent, in remission   . Unspecified hypothyroidism   . Osteoarthritis of shoulder   . Essential hypertension   . Onychomycosis of toenail          Medications:     Prior to Admission medications    Medication Sig Start Date End Date Taking? Authorizing Provider   Choline Fenofibrate (FENOFIBRIC ACID) 135 MG Capsule Delayed Release TAKE 1 CAPSULE DAILY 03/01/14   Cassandra Pace, FNP   clonazePAM  (KLONOPIN) 1 MG tablet Take 1 tablet (1 mg total) by mouth nightly as needed for Anxiety. 07/02/14   Cassandra Pace, FNP   COLLAGEN PO Take by mouth.    [provider]   diclofenac (VOLTAREN) 75 MG EC tablet Take 75 mg by mouth 2 (two) times daily.    05/14/14   [provider]   GARCINIA CAMBOGIA-CHROMIUM PO Take by mouth.    [provider]   levothyroxine (SYNTHROID, LEVOTHROID) 112 MCG tablet TAKE ONE TABLET BY MOUTH ONCE DAILY 07/20/14   Graciela Husbands, DO   losartan-hydrochlorothiazide [facility]) 50-12.5 MG per tablet TAKE 1 TABLET DAILY 03/30/14   Cassandra Pace, FNP   lovastatin (MEVACOR) 20 MG tablet TAKE ONE TABLET BY MOUTH ONCE DAILY FOR CHOLESTEROL 03/01/14   Graciela Husbands, DO   pantoprazole (PROTONIX) 40 MG tablet TAKE ONE TABLET BY MOUTH ONCE DAILY 06/19/14   Graciela Husbands, DO   potassium chloride (K-DUR,KLOR-CON) 20 MEQ tablet TAKE 1 TABLET DAILY 07/09/14   Cassandra Pace, FNP   venlafaxine (EFFEXOR-XR) 75 MG 24 hr capsule TAKE 1 CAPSULE DAILY 09/03/14   Cassandra Pace, FNP        Review of Systems:   Review of Systems   Constitutional: Negative.    HENT: Negative.    Musculoskeletal: Positive for arthralgias.   Skin:        Fungus on third  toe of left foot    Psychiatric/Behavioral: Negative.          Physical Exam:      Filed Vitals:    10/29/14 1309   BP: 103/67   Pulse: 83   Temp: 98.1 F (36.7 C)   SpO2: 98%     Physical Exam   Constitutional: She is oriented to person, place, and time. She appears well-developed and well-nourished.   Neck: Normal range of motion. Neck supple. Carotid bruit is not present.   Cardiovascular: Normal rate, regular rhythm, S1 normal, S2 normal, normal heart sounds, intact distal pulses and normal pulses.  Exam reveals no gallop and no friction rub.    No murmur heard.  Pulmonary/Chest: Effort normal and breath sounds normal.   Abdominal: Soft. Bowel sounds are normal.   Musculoskeletal: Normal range of motion. She exhibits no edema.   Lymphadenopathy:      She has no cervical adenopathy.   Neurological: She is alert and oriented to person, place, and time.   Skin: Skin is warm and dry.   Fungus on third toe nail of left foot   Several anterior chest wall lesions     Psychiatric: She has a normal mood and affect. Her behavior is normal. Judgment and thought content normal.     Assessment:      Encounter Diagnoses   Name Primary?   . Essential hypertension Yes   . Onychomycosis of toenail    . Hypothyroidism, unspecified hypothyroidism type    . Mixed hyperlipidemia      Plan:     Return in about 3 months (around 01/29/2015).  Reviewed previous blood work  Continue current medications   Reviewed Health Maintenance   Will re evaluate lesions on chest at next visit   Instructed Pt to refrain from picking at lesions on chest    Scribed for Dr. Beverely Pace, DNP by Cassandra [name], Medical Scribe.  10/29/2014      Cassandra [name], Cassandra [name], personally performed the services described in this documentation, as scribed by, Cassandra [name], in my presence. Cassandra [name] agree with this documentation and It is both accurate and complete.

## 2014-11-08 ENCOUNTER — Other Ambulatory Visit (INDEPENDENT_AMBULATORY_CARE_PROVIDER_SITE_OTHER): Payer: Self-pay | Admitting: Family

## 2014-11-24 ENCOUNTER — Other Ambulatory Visit (RURAL_HEALTH_CENTER): Payer: Self-pay | Admitting: Internal Medicine

## 2014-12-02 ENCOUNTER — Other Ambulatory Visit (INDEPENDENT_AMBULATORY_CARE_PROVIDER_SITE_OTHER): Payer: Self-pay | Admitting: Family

## 2014-12-24 ENCOUNTER — Encounter (RURAL_HEALTH_CENTER): Payer: Self-pay

## 2014-12-24 NOTE — Progress Notes (Signed)
Immunization update received from an outside record. Immunization record updated//jlm

## 2015-01-04 ENCOUNTER — Other Ambulatory Visit (INDEPENDENT_AMBULATORY_CARE_PROVIDER_SITE_OTHER): Payer: Self-pay | Admitting: Family

## 2015-01-18 ENCOUNTER — Telehealth (RURAL_HEALTH_CENTER): Payer: Self-pay | Admitting: Family

## 2015-01-18 MED ORDER — PREDNISONE 10 MG PO TABS
ORAL_TABLET | ORAL | Status: DC
Start: 2015-01-18 — End: 2015-03-01

## 2015-01-18 NOTE — Telephone Encounter (Signed)
Please review, order if indicated,and reply to the pool with further instructions.//jlm

## 2015-01-18 NOTE — Telephone Encounter (Signed)
RX sent to [facility]

## 2015-01-18 NOTE — Telephone Encounter (Signed)
Pt notified//jlm

## 2015-01-18 NOTE — Telephone Encounter (Signed)
PATIENT CALLED REQUESTING AN RX FOR POISON IVY.  SHE USES [facility].  SHE SAID THAT THE RASH HAS NOW SPREAD TOP HER FACE.  CAN YOU CALL SOMETHING IN FOR HER?

## 2015-01-24 ENCOUNTER — Other Ambulatory Visit (INDEPENDENT_AMBULATORY_CARE_PROVIDER_SITE_OTHER): Payer: Self-pay | Admitting: Internal Medicine

## 2015-01-25 HISTORY — PX: FOOT SURGERY: SHX648

## 2015-01-28 ENCOUNTER — Telehealth (RURAL_HEALTH_CENTER): Payer: Self-pay | Admitting: Internal Medicine

## 2015-01-28 NOTE — Telephone Encounter (Signed)
Pt called to make Dr. Amada Jupiter aware that she had foot surgery and she  was returning to Dr. Freida Busman in [place] for a dressing change.

## 2015-01-28 NOTE — Telephone Encounter (Signed)
Please be advise of note below.//bc

## 2015-01-28 NOTE — Telephone Encounter (Signed)
I have no idea why I need to know this. I never see her.

## 2015-01-28 NOTE — Telephone Encounter (Signed)
Cassandra [name], I am going to forward this message to you this since you see her... It was sent to Dr. Amada Jupiter earlier.Marland KitchenMarland Kitchenpkp

## 2015-02-01 ENCOUNTER — Telehealth (RURAL_HEALTH_CENTER): Payer: Self-pay | Admitting: Internal Medicine

## 2015-02-01 NOTE — Telephone Encounter (Signed)
Patient clonazepam down  To 3 pills [facility] day supply insurance changing, will have good coverage, can you send in [facility] day supply to [facility] by first of year will have insurance and pharmacy straightened out.

## 2015-02-02 MED ORDER — CLONAZEPAM 1 MG PO TABS
1.0000 mg | ORAL_TABLET | Freq: Every evening | ORAL | Status: DC | PRN
Start: 2015-02-02 — End: 2015-04-19

## 2015-02-02 NOTE — Telephone Encounter (Signed)
Faxed//jlm

## 2015-02-02 NOTE — Telephone Encounter (Signed)
Done to [facility]

## 2015-02-04 ENCOUNTER — Ambulatory Visit (RURAL_HEALTH_CENTER): Payer: Medicare Other | Admitting: Family

## 2015-02-08 ENCOUNTER — Other Ambulatory Visit (RURAL_HEALTH_CENTER): Payer: Self-pay | Admitting: Family

## 2015-02-08 MED ORDER — FENOFIBRIC ACID 135 MG PO CPDR
1.0000 | DELAYED_RELEASE_CAPSULE | Freq: Every day | ORAL | Status: DC
Start: 2015-02-08 — End: 2015-04-12

## 2015-02-10 ENCOUNTER — Encounter (RURAL_HEALTH_CENTER): Payer: Self-pay

## 2015-03-01 ENCOUNTER — Encounter (RURAL_HEALTH_CENTER): Payer: Self-pay | Admitting: Family

## 2015-03-01 ENCOUNTER — Other Ambulatory Visit
Admission: RE | Admit: 2015-03-01 | Discharge: 2015-03-01 | Disposition: A | Discharge status: Home / Self Care | Payer: PRIVATE HEALTH INSURANCE | Source: Ambulatory Visit | Attending: Family | Admitting: Family

## 2015-03-01 ENCOUNTER — Ambulatory Visit (RURAL_HEALTH_CENTER): Payer: Medicare Other | Admitting: Family

## 2015-03-01 VITALS — BP 130/90 | HR 88 | Temp 98.6°F | Resp 16 | Ht 63.5 in | Wt 171.6 lb

## 2015-03-01 DIAGNOSIS — E782 Mixed hyperlipidemia: Secondary | ICD-10-CM

## 2015-03-01 DIAGNOSIS — I10 Essential (primary) hypertension: Secondary | ICD-10-CM

## 2015-03-01 DIAGNOSIS — E039 Hypothyroidism, unspecified: Secondary | ICD-10-CM

## 2015-03-01 LAB — CBC AND DIFFERENTIAL
Basophils %: 0.7 % (ref 0.0–3.0)
Basophils Absolute: 0 10*3/uL (ref 0.0–0.3)
Eosinophils %: 2.7 % (ref 0.0–7.0)
Eosinophils Absolute: 0.2 10*3/uL (ref 0.0–0.8)
Hematocrit: 39.4 % (ref 36.0–48.0)
Hemoglobin: 13.1 gm/dL (ref 12.0–16.0)
Lymphocytes Absolute: 1.3 10*3/uL (ref 0.6–5.1)
Lymphocytes: 20.2 % (ref 15.0–46.0)
MCH: 31 pg (ref 28–35)
MCHC: 33 gm/dL (ref 32–36)
MCV: 92 fL (ref 80–100)
MPV: 8.9 fL (ref 6.0–10.0)
Monocytes Absolute: 0.6 10*3/uL (ref 0.1–1.7)
Monocytes: 9 % (ref 3.0–15.0)
Neutrophils %: 67.4 % (ref 42.0–78.0)
Neutrophils Absolute: 4.4 10*3/uL (ref 1.7–8.6)
PLT CT: 293 10*3/uL (ref 130–440)
RBC: 4.27 10*6/uL (ref 3.80–5.00)
RDW: 12.4 % (ref 11.0–14.0)
WBC: 6.5 10*3/uL (ref 4.0–11.0)

## 2015-03-01 LAB — COMPREHENSIVE METABOLIC PANEL
ALT: 17 U/L (ref 0–55)
AST (SGOT): 20 U/L (ref 10–42)
Albumin/Globulin Ratio: 1.33 Ratio (ref 0.70–1.50)
Albumin: 4 gm/dL (ref 3.5–5.0)
Alkaline Phosphatase: 41 U/L (ref 40–145)
Anion Gap: 14.6 mMol/L (ref 7.0–18.0)
BUN / Creatinine Ratio: 22.5 Ratio (ref 10.0–30.0)
BUN: 32 mg/dL — ABNORMAL HIGH (ref 7–22)
Bilirubin, Total: 0.3 mg/dL (ref 0.1–1.2)
CO2: 25.7 mMol/L (ref 20.0–30.0)
Calcium: 9.7 mg/dL (ref 8.5–10.5)
Chloride: 106 mMol/L (ref 98–110)
Creatinine: 1.42 mg/dL — ABNORMAL HIGH (ref 0.60–1.20)
EGFR: 36 mL/min/{1.73_m2}
Globulin: 3 gm/dL (ref 2.0–4.0)
Glucose: 98 mg/dL (ref 70–99)
Osmolality Calc: 290 mOsm/kg (ref 275–300)
Potassium: 4.3 mMol/L (ref 3.5–5.3)
Protein, Total: 7 gm/dL (ref 6.0–8.3)
Sodium: 142 mMol/L (ref 136–147)

## 2015-03-01 LAB — LIPID PANEL
Cholesterol: 153 mg/dL (ref 75–199)
Coronary Heart Disease Risk: 4.78
HDL: 32 mg/dL — ABNORMAL LOW (ref 45–65)
LDL Calculated: 91 mg/dL
Triglycerides: 150 mg/dL (ref 10–150)
VLDL: 30 (ref 0–40)

## 2015-03-01 LAB — THYROID STIMULATING HORMONE (TSH), REFLEX ON ABNORMAL TO FREE T4, SERUM: TSH: 1.98 u[IU]/mL (ref 0.40–4.20)

## 2015-03-01 LAB — T3, FREE: T3, Free: 1.6 pg/mL — ABNORMAL LOW (ref 1.71–3.71)

## 2015-03-01 NOTE — Progress Notes (Signed)
PROGRESS NOTE    Patient Name: Cassandra [name],Cassandra [name]    Subjective  History of Present Illness:   Cassandra [name] is a 79 y.o. female who presents with:  Chief Complaint   Patient presents with   . Hypertension        Cassandra [name] is a 79 YO female who presents with hypertension. She is here today for a 3 month medical management follow up for multiple chronic issues. She recently had surgery on her left foot and has a pin in her second toe due to a hammer toe. It will be removed by Dr. Freida Busman next week.       The patient is currently taking Hyzaar for HTN control. BP in the office today is 130/90. She denies chest pain, chest pressure/discomfort, claudication, dyspnea, exertional chest pressure/discomfort, fatigue, irregular heart beat, lower extremity edema, near-syncope, orthopnea, palpitations, paroxysmal nocturnal dyspnea, syncope and tachypnea.        The patient is currently taking Levothyroxine 112 mcg daily. The patient denies symptoms of hypothyroidism including fatigue, cold intolerance, dry skin, scalp hair thinning, constipation, depression, weight gain, slowed mentation, menorrhagia and muscle cramps.      Patient is compliant with medication and diet with hyperlipidemia. Last labs showed a total cholesterol of 162 and a HDL of 30 on 07/02/14.    The following portions of the patient's history were reviewed and updated as appropriate: allergies, current medications, past family history, past medical history, past social history, past surgical history and problem list.  Problem List:     Patient Active Problem List   Diagnosis   . Other and unspecified hyperlipidemia   . Major depressive disorder, recurrent, in remission   . Unspecified hypothyroidism   . Osteoarthritis of shoulder   . Essential hypertension   . Onychomycosis of toenail          Medications:     Prior to Admission medications    Medication Sig Start Date End Date Taking? Authorizing Provider   Choline Fenofibrate (FENOFIBRIC ACID) 135 MG  Capsule Delayed Release Take 1 capsule by mouth daily. 02/08/15   Beverely Pace, FNP   clonazePAM (KLONOPIN) 1 MG tablet Take 1 tablet (1 mg total) by mouth nightly as needed for Anxiety. 02/02/15   Beverely Pace, FNP   COLLAGEN PO Take by mouth.    [provider]   diclofenac (VOLTAREN) 75 MG EC tablet Take 75 mg by mouth 2 (two) times daily.    05/14/14   [provider]   GARCINIA CAMBOGIA-CHROMIUM PO Take by mouth.    [provider]   levothyroxine (SYNTHROID, LEVOTHROID) 112 MCG tablet TAKE ONE TABLET BY MOUTH ONCE DAILY 07/20/14   Graciela Husbands, DO   losartan-hydrochlorothiazide [facility]) 50-12.5 MG per tablet TAKE 1 TABLET DAILY 12/03/14   Beverely Pace, FNP   lovastatin (MEVACOR) 20 MG tablet TAKE ONE TABLET BY MOUTH ONCE DAILY FOR CHOLESTEROL 01/25/15   Graciela Husbands, DO   Multiple Vitamin (MULTIVITAMIN) tablet Take 1 tablet by mouth daily.    [provider]   pantoprazole (PROTONIX) 40 MG tablet TAKE ONE TABLET BY MOUTH ONCE DAILY 11/24/14   Graciela Husbands, DO   potassium chloride (K-DUR,KLOR-CON) 20 MEQ tablet TAKE 1 TABLET DAILY 01/04/15   Beverely Pace, FNP   predniSONE (DELTASONE) 10 MG tablet 60 mg x 2d, 50 mg x 2d, 40 mg x 2d, 30 mg x 2d, 20 mg x 2 d, 10 mg x 2d 01/18/15   Beverely Pace, FNP  venlafaxine (EFFEXOR-XR) 75 MG 24 hr capsule TAKE 1 CAPSULE DAILY 09/03/14   Beverely Pace, FNP     Review of Systems:   Review of Systems   Constitutional: Negative.    HENT: Negative.    Respiratory: Negative.    Cardiovascular: Negative.    Gastrointestinal: Negative.    Musculoskeletal: Negative.    Skin: Negative.    Psychiatric/Behavioral: Negative.      Physical Exam:      Filed Vitals:    03/01/15 0912   BP: 130/90   Pulse: 88   Temp: 98.6 F (37 C)   Resp: 16   SpO2: 96%     Physical Exam   Constitutional: She is oriented to person, place, and time. She appears well-developed and well-nourished.   Neck: Normal range of motion. Neck supple. Carotid bruit is not  present.   Cardiovascular: Normal rate, regular rhythm, S1 normal, S2 normal, normal heart sounds, intact distal pulses and normal pulses.  Exam reveals no gallop and no friction rub.    No murmur heard.  Pulmonary/Chest: Effort normal and breath sounds normal.   Abdominal: Soft. Bowel sounds are normal.   Musculoskeletal: Normal range of motion. She exhibits no edema.   Lymphadenopathy:     She has no cervical adenopathy.   Neurological: She is alert and oriented to person, place, and time.   Skin: Skin is warm and dry.   Psychiatric: She has a normal mood and affect. Her behavior is normal. Judgment and thought content normal.     Assessment:      Encounter Diagnoses   Name Primary?   . Essential hypertension Yes   . Hypothyroidism, unspecified type    . Mixed hyperlipidemia      Plan:   Return in about 3 months (around 05/30/2015). Medicare Wellness Exam     Continue current medications   Reviewed Health Maintenance and Fall Risk Assessment   Ordered CBC, CMP, TSH reflex, T3, and Lipid Panel (fasting)      Scribed for Dr. Beverely Pace, DNP by Dorthula Nettles, Medical Scribe.  03/01/2015      I, Dr. Beverely Pace, personally performed the services described in this documentation, as scribed by, Dorthula Nettles, in my presence. I agree with this documentation and It is both accurate and complete.

## 2015-04-02 ENCOUNTER — Other Ambulatory Visit (RURAL_HEALTH_CENTER): Payer: Self-pay | Admitting: Family

## 2015-04-05 ENCOUNTER — Telehealth (RURAL_HEALTH_CENTER): Payer: Self-pay | Admitting: Family

## 2015-04-05 NOTE — Telephone Encounter (Signed)
L/M to return call. What meds?//jlm

## 2015-04-05 NOTE — Telephone Encounter (Signed)
L/M to bring all meds and let us know what meds need refilling at appt on Thurs//jlm

## 2015-04-05 NOTE — Telephone Encounter (Signed)
Patient has new rx insurance will need scripts sent to [facility] in [place].  Please give her a call back.

## 2015-04-08 ENCOUNTER — Ambulatory Visit (RURAL_HEALTH_CENTER): Payer: Medicare Other | Admitting: Family

## 2015-04-08 ENCOUNTER — Encounter (RURAL_HEALTH_CENTER): Payer: Self-pay | Admitting: Family

## 2015-04-08 VITALS — BP 137/85 | HR 79 | Temp 96.7°F | Resp 16 | Ht 64.0 in | Wt 170.4 lb

## 2015-04-08 DIAGNOSIS — Z780 Asymptomatic menopausal state: Secondary | ICD-10-CM

## 2015-04-08 DIAGNOSIS — Z0000 Encounter for general adult medical examination without abnormal findings: Secondary | ICD-10-CM

## 2015-04-08 NOTE — Progress Notes (Signed)
Medicare Annual Wellness Visit    Date Time: 04/08/2015 3:29 PM  Patient Name: Cassandra [name], Cassandra [name]  DoB: 07-04-1935    History of Present Illness:    This a 80 y.o. female who presents for Subsequent Annual Wellness visit.     Denym Rhonna Holster reports she is doing well with no new complaints. She is compliant with all of her other medications with no issues.  Her energy level is unchanged from baseline and remains able to perform most usual functions without excessive fatigue or difficulty. Weight has been stable since last visit. The patient's appetite has been good. Patient eats about anything with no dietary limitations.       The patient is currently taking Hyzaar 50 - 12.5 MG for HTN control. BP in the office today is 137/85. She denies chest pain, chest pressure/discomfort, claudication, dyspnea, exertional chest pressure/discomfort, fatigue, irregular heart beat, lower extremity edema, near-syncope, orthopnea, palpitations, paroxysmal nocturnal dyspnea, syncope and tachypnea.         Patient is compliant with medication and diet with hyperlipidemia. Last labs showed a total cholesterol of 153 and a HDL of 32 on 03/01/15.     The patient is currently taking Levothyroxine 112 mcg daily. The patient denies symptoms of hypothyroidism including fatigue, cold intolerance, dry skin, scalp hair thinning, constipation, depression, weight gain, slowed mentation, menorrhagia and muscle cramps.    Health Maintenance:  UTD tetanus shot? Unsure  UTD pneumonia shot in appropriate age range? Yes   UTD flu shot? Yes  UTD colonoscopy? Yes  Zoster vaccine? Yes    WOMEN:  UTD mammo? No   UTD DEXA? No    Review of Systems    Past Medical History:     Past Medical History   Diagnosis Date   . Hypertension    . Hyperlipidemia    . Disorder of thyroid    . Essential hypertension 10/29/2014   . Onychomycosis of toenail 10/29/2014       Past Surgical History:     Past Surgical History   Procedure Laterality Date   . Joint replacement        Left knee   . Correction hammer toe       Right foot   . Hysterectomy       Vag   . Cholecystectomy     . Other surgical history       Internal pelvic suspension   . Appendectomy     . Ankle surgery       Left. Screw placed.   . Salpingo oophorectomy     . Foot surgery  01/25/15     Pin in Right foot       Family History:     Family History   Problem Relation Age of Onset   . Cancer Mother    . Alzheimer's disease Father    . Obesity Sister    . Diabetes Sister    . Arthritis Sister    . Stroke Brother    . Suicidality Son    . Diabetes Maternal Grandmother    . Heart attack Maternal Grandfather    . Alzheimer's disease Paternal Grandmother    . Stroke Paternal Grandfather      Circulation issues       Social History:     Social History     Social History   . Marital Status: Married     Spouse Name: N/A   . Number of Children: N/A   .  Years of Education: N/A     Occupational History   . Not on file.     Social History Main Topics   . Smoking status: Never Smoker    . Smokeless tobacco: Never Used   . Alcohol Use: No   . Drug Use: No   . Sexual Activity: Not on file     Other Topics Concern   . Not on file     Social History Narrative       Allergies:   No Known Allergies    Medications:     Prior to Admission medications    Medication Sig Start Date End Date Taking? Authorizing Provider   Biotin 5000 MCG Cap Take by mouth daily.    [provider]   Choline Fenofibrate (FENOFIBRIC ACID) 135 MG Capsule Delayed Release Take 1 capsule by mouth daily. 02/08/15   Beverely Pace, FNP   clonazePAM (KLONOPIN) 1 MG tablet Take 1 tablet (1 mg total) by mouth nightly as needed for Anxiety. 02/02/15   Beverely Pace, FNP   Coenzyme Q10 (CO Q 10) 100 MG Cap Take by mouth daily.    [provider]   COLLAGEN PO Take by mouth daily as needed.       [provider]   diclofenac (VOLTAREN) 75 MG EC tablet Take 75 mg by mouth 2 (two) times daily as needed.    05/14/14   [provider]   GARCINIA  CAMBOGIA-CHROMIUM PO Take by mouth.    [provider]   levothyroxine (SYNTHROID, LEVOTHROID) 112 MCG tablet TAKE ONE TABLET BY MOUTH ONCE DAILY 07/20/14   Graciela Husbands, DO   losartan-hydrochlorothiazide [facility]) 50-12.5 MG per tablet TAKE 1 TABLET DAILY 12/03/14   Beverely Pace, FNP   lovastatin (MEVACOR) 20 MG tablet TAKE ONE TABLET BY MOUTH ONCE DAILY FOR CHOLESTEROL 01/25/15   Graciela Husbands, DO   Multiple Vitamin (MULTIVITAMIN) tablet Take 1 tablet by mouth daily.    [provider]   oxyCODONE-acetaminophen (PERCOCET) 5-325 MG per tablet every 8 (eight) hours as needed.    01/25/15   [provider]   pantoprazole (PROTONIX) 40 MG tablet TAKE ONE TABLET BY MOUTH ONCE DAILY 11/24/14   Graciela Husbands, DO   Polyethylene Glycol 3350 (MIRALAX PO) Take by mouth daily.    [provider]   potassium chloride (K-DUR,KLOR-CON) 20 MEQ tablet TAKE 1 TABLET DAILY 04/02/15   Beverely Pace, FNP   venlafaxine (EFFEXOR-XR) 75 MG 24 hr capsule TAKE 1 CAPSULE DAILY 09/03/14   Beverely Pace, FNP        Providers of Care (Not required for IPPE):   Patient Care Team:  Graciela Husbands, DO as PCP - General (Internal Medicine)       Alcohol Use Assessment Screen   See scanned copy of HRA.    How often did the patient have a drink containing alcohol in the past year?       0   How many drinks did you have on a typical day when you were drinking in the past year?       0   How often did you have five or more drinks on one occasion in the past year?       0    Total Score        Depression Screen   See scanned copy of HRA.    How often over the last two weeks have you felt little interest or pleasure in doing  things? Not at all - Score 0   How often over the last two weeks have you felt down, depressed, or hopeless? Not at all - Score 0       Interpretation of total score:     0-4  None     5-9  Mild   10-14 Moderate   15-19 Moderately severe   20-27  Severe      Advanced Care Planning:   Discussion of advanced  directives held (patient preference, physician agreement/disagreement):    A copy was given to Pt to be completed and returned to office to be filed in her chart      Functional Ability/Safety Screen:   See scanned copy of HRA.  Was the patient's Timed Get Up & Go test unsteady or longer than 30 seconds? No       Mini-Cog Test  Instructions for the Mini-Cog Test  This is a 3 minute instrument to screen for cognitive impairment in older adults in the primary care setting. The Mini-Cog uses a three-item recall test for memory and a simply scored clock-drawing test (CDT). The latter serves as an "informative distractor", helping to clarify scores when memory recall score is intermediate.    Instructions:  1. Instruct patient to listen carefully and repeat the following:  "[place], [place], [place]"    2. Inside the circle draw the hours of a clock as if a child would draw them. Place the hands of the clock to represent the time "forty five minutes past ten oclock"    SCORE: Normal    3.Ask  the patient to repeat the three words given previously.    SCORE:  3    Scoring:  0  Positive for cognitive impairment  1-2 & Abnormal CDT then positive for cognitive impairment, then perform MMSE  1-2 & Normal CDT then negative for cognitive impairment  3 Negative screen for dementia (no need to score CDT)    Health Risk Assessment:     Activities of Daily Living:   Because of a health or memory problem do you have any difficulty with bathing or showering? No     Because of a health or memory problem do you have any difficulty with managing money- such as paying bills and keeping track of expenses? No   Because of a health or memory problem do you have any difficulty with walking several blocks? No   Because of a health or memory problem do you have any difficulty with pulling or pushing large objects like a living room chair? No   Because of a health or memory problem do you have any difficulty dressing yourself ? No    Because of a health or memory problem do you have any difficulty with weightlifting- such as transferring yourself to the toilet,clean yourself, or have incontinence of stool or urine? No   Comments:     Instrumental Activities of Daily Living:   In the past 7 days,did you need help from others to take care of things such as laundry and housekeeping, using the telephone, food preparation, transportation, or taking your own medications? No   Comments:         Social/Emotional Support:   During the past 4 weeks, was someone available to help if patient needed and wanted help? Yes   How often does patient get the social and emotional support needed? Yes       Injury Risk:   Do you live alone? No  Are emergency numbers kept by the phone and regularly updated? Yes   Are all household members aware of the dangers of smoking, especially in bed? Yes   Are firearms stored unloaded and securely locked? N/A   Are working smoke alarms and Scientist, forensic available for use? Yes   Do all household members know how to use them? Yes   Are electrical cords in working order, easily seen, and not run under rugs or carpet or wrapped around nails? Yes   Are non slip mats in all bathtubs and showers? No   Do all stairways have a railing or banister? Yes   Are doorways, halls, and stairs free of clutter? Yes   Are there stairs in patients home? Yes   Are sidewalks and all outdoor steps clear of tools, toys, and other articles? Yes   Is there carpet flooring in patient's home? Yes   Are there area rugs in patients home? Yes   Does patient ever feel unsteady when walking? No   Does patient feel dizzy or lightheaded? No   Has patient fallen 2 or more times in the past year? No   What caused patient to fall? No   Is patient afraid of falling? No   Does patient have carbon monoxide detectors in home? No   Does patient have animals in home? No   Does patient drive? Yes   Does patient wear seatbelts? Yes   Does patient feel they can  safely operate a car? Yes       Physical Exam:     Physical Exam   Constitutional: She is oriented to person, place, and time. She appears well-developed and well-nourished.   Neck: Normal range of motion. Neck supple. Carotid bruit is not present.   Cardiovascular: Normal rate, regular rhythm, S1 normal, S2 normal, normal heart sounds, intact distal pulses and normal pulses.  Exam reveals no gallop and no friction rub.    No murmur heard.  Pulmonary/Chest: Effort normal and breath sounds normal.   Abdominal: Soft. Bowel sounds are normal.   Musculoskeletal: Normal range of motion. She exhibits no edema.   Multiple varicosities in LE B/L   Lymphadenopathy:     She has no cervical adenopathy.   Neurological: She is alert and oriented to person, place, and time.   Skin: Skin is warm and dry. No rash noted.   Psychiatric: She has a normal mood and affect. Her behavior is normal. Judgment and thought content normal.           Hearing Evaluation:     Does a hearing problem cause the patient to feel embarrassed when meeting new people? No (0 pts)   Does a hearing problem cause the patient to feel frustrated when talking to members of their family? No (0 pts)   Does patient have difficulty  hearing when someone speaks a whisper? No (0 pts)   Does patient feel handicapped by a hearing problem? No (0 pts)   Does a hearing problem cause the patient difficulty when visiting friends, relatives or neighbors? No (0 pts)   Does a hearing problem cause patient to attend religious services less often than they would like? No (0 pts)   Does a hearing problem cause patient to have arguments with family members? No (0 pts)   Does a hearing problem cause patient difficulty when listening to TV or radio? No (0 pts)   Does patient have difficulty with hearing limits or hampers their personal or social  life? No (0 pts)   Does a hearing problem cause patient difficulty when in a restaurant  with relatives or friends? No (0 pts)      Interpretation of  score:  0-9 suggest no hearing handicap  10-24 suggests mild- moderate hearing handicap  26-40 suggests significant hearing handicap    Assessment     1. Medicare annual wellness visit, subsequent       Plan:     Return in about 3 months (around 07/07/2015).    Continue current medications  Reviewed Health Maintenance   Ordered Bone Dxa Scan    Scribed for Dr. Beverely Pace, DNP by Dorthula Nettles, Medical Scribe.      I, Dr. Beverely Pace, personally performed the services described in this documentation, as scribed by, Dorthula Nettles, in my presence. I agree with this documentation and It is both accurate and complete.    04/08/2015 3:29 PM

## 2015-04-08 NOTE — Progress Notes (Signed)
Medicare Annual Wellness Visit    Date Time: 04/08/2015 3:39 PM  Patient Name: Cassandra [name], Cassandra [name]  DoB: 07-17-1935    History of Present Illness:    This a 80 y.o. female who presents for Subsequent Annual Wellness visit.     Audie Kimyetta Flott reports she is doing well with no new complaints. She is compliant with all of her other medications with no issues.  Her energy level is unchanged from baseline and remains able to perform most usual functions without excessive fatigue or difficulty. Weight has been stable since last visit. The patient's appetite has been good. Patient eats about anything with no dietary limitations.       The patient is currently taking Hyzaar 50 - 12.5 MG for HTN control. BP in the office today is 137/85. She denies chest pain, chest pressure/discomfort, claudication, dyspnea, exertional chest pressure/discomfort, fatigue, irregular heart beat, lower extremity edema, near-syncope, orthopnea, palpitations, paroxysmal nocturnal dyspnea, syncope and tachypnea.         Patient is compliant with medication and diet with hyperlipidemia. Last labs showed a total cholesterol of 153 and a HDL of 32 on 03/01/15.     The patient is currently taking Levothyroxine 112 mcg daily. The patient denies symptoms of hypothyroidism including fatigue, cold intolerance, dry skin, scalp hair thinning, constipation, depression, weight gain, slowed mentation, menorrhagia and muscle cramps.    Health Maintenance:  UTD tetanus shot? Unsure  UTD pneumonia shot in appropriate age range? Yes   UTD flu shot? Yes  UTD colonoscopy? Yes  Zoster vaccine? Yes    WOMEN:  UTD mammo? No   UTD DEXA? No    Review of Systems    Past Medical History:     Past Medical History   Diagnosis Date   . Hypertension    . Hyperlipidemia    . Disorder of thyroid    . Essential hypertension 10/29/2014   . Onychomycosis of toenail 10/29/2014       Past Surgical History:     Past Surgical History   Procedure Laterality Date   . Joint replacement        Left knee   . Correction hammer toe       Right foot   . Hysterectomy       Vag   . Cholecystectomy     . Other surgical history       Internal pelvic suspension   . Appendectomy     . Ankle surgery       Left. Screw placed.   . Salpingo oophorectomy     . Foot surgery  01/25/15     Pin in Right foot       Family History:     Family History   Problem Relation Age of Onset   . Cancer Mother    . Alzheimer's disease Father    . Obesity Sister    . Diabetes Sister    . Arthritis Sister    . Stroke Brother    . Suicidality Son    . Diabetes Maternal Grandmother    . Heart attack Maternal Grandfather    . Alzheimer's disease Paternal Grandmother    . Stroke Paternal Grandfather      Circulation issues       Social History:     Social History     Social History   . Marital Status: Married     Spouse Name: N/A   . Number of Children: N/A   .  Years of Education: N/A     Occupational History   . Not on file.     Social History Main Topics   . Smoking status: Never Smoker    . Smokeless tobacco: Never Used   . Alcohol Use: No   . Drug Use: No   . Sexual Activity: Not on file     Other Topics Concern   . Not on file     Social History Narrative       Allergies:   No Known Allergies    Medications:     Prior to Admission medications    Medication Sig Start Date End Date Taking? Authorizing Provider   Biotin 5000 MCG Cap Take by mouth daily.    [provider]   Choline Fenofibrate (FENOFIBRIC ACID) 135 MG Capsule Delayed Release Take 1 capsule by mouth daily. 02/08/15   Beverely Pace, FNP   clonazePAM (KLONOPIN) 1 MG tablet Take 1 tablet (1 mg total) by mouth nightly as needed for Anxiety. 02/02/15   Beverely Pace, FNP   Coenzyme Q10 (CO Q 10) 100 MG Cap Take by mouth daily.    [provider]   COLLAGEN PO Take by mouth daily as needed.       [provider]   diclofenac (VOLTAREN) 75 MG EC tablet Take 75 mg by mouth 2 (two) times daily as needed.    05/14/14   [provider]   GARCINIA  CAMBOGIA-CHROMIUM PO Take by mouth.    [provider]   levothyroxine (SYNTHROID, LEVOTHROID) 112 MCG tablet TAKE ONE TABLET BY MOUTH ONCE DAILY 07/20/14   Graciela Husbands, DO   losartan-hydrochlorothiazide [facility]) 50-12.5 MG per tablet TAKE 1 TABLET DAILY 12/03/14   Beverely Pace, FNP   lovastatin (MEVACOR) 20 MG tablet TAKE ONE TABLET BY MOUTH ONCE DAILY FOR CHOLESTEROL 01/25/15   Graciela Husbands, DO   Multiple Vitamin (MULTIVITAMIN) tablet Take 1 tablet by mouth daily.    [provider]   oxyCODONE-acetaminophen (PERCOCET) 5-325 MG per tablet every 8 (eight) hours as needed.    01/25/15   [provider]   pantoprazole (PROTONIX) 40 MG tablet TAKE ONE TABLET BY MOUTH ONCE DAILY 11/24/14   Graciela Husbands, DO   Polyethylene Glycol 3350 (MIRALAX PO) Take by mouth daily.    [provider]   potassium chloride (K-DUR,KLOR-CON) 20 MEQ tablet TAKE 1 TABLET DAILY 04/02/15   Beverely Pace, FNP   venlafaxine (EFFEXOR-XR) 75 MG 24 hr capsule TAKE 1 CAPSULE DAILY 09/03/14   Beverely Pace, FNP        Providers of Care (Not required for IPPE):   Patient Care Team:  Graciela Husbands, DO as PCP - General (Internal Medicine)       Alcohol Use Assessment Screen   See scanned copy of HRA.    How often did the patient have a drink containing alcohol in the past year?       0   How many drinks did you have on a typical day when you were drinking in the past year?       0   How often did you have five or more drinks on one occasion in the past year?       0    Total Score        Depression Screen   See scanned copy of HRA.    How often over the last two weeks have you felt little interest or pleasure in doing  things? Not at all - Score 0   How often over the last two weeks have you felt down, depressed, or hopeless? Not at all - Score 0       Interpretation of total score:     0-4  None     5-9  Mild   10-14 Moderate   15-19 Moderately severe   20-27  Severe      Advanced Care Planning:   Discussion of advanced  directives held (patient preference, physician agreement/disagreement):    A copy was given to Pt to be completed and returned to office to be filed in her chart      Functional Ability/Safety Screen:   See scanned copy of HRA.  Was the patient's Timed Get Up & Go test unsteady or longer than 30 seconds? No       Mini-Cog Test  Instructions for the Mini-Cog Test  This is a 3 minute instrument to screen for cognitive impairment in older adults in the primary care setting. The Mini-Cog uses a three-item recall test for memory and a simply scored clock-drawing test (CDT). The latter serves as an "informative distractor", helping to clarify scores when memory recall score is intermediate.    Instructions:  1. Instruct patient to listen carefully and repeat the following:  "[place], [place], [place]"    2. Inside the circle draw the hours of a clock as if a child would draw them. Place the hands of the clock to represent the time "forty five minutes past ten oclock"    SCORE: Normal    3.Ask  the patient to repeat the three words given previously.    SCORE:  3    Scoring:  0  Positive for cognitive impairment  1-2 & Abnormal CDT then positive for cognitive impairment, then perform MMSE  1-2 & Normal CDT then negative for cognitive impairment  3 Negative screen for dementia (no need to score CDT)    Health Risk Assessment:     Activities of Daily Living:   Because of a health or memory problem do you have any difficulty with bathing or showering? No     Because of a health or memory problem do you have any difficulty with managing money- such as paying bills and keeping track of expenses? No   Because of a health or memory problem do you have any difficulty with walking several blocks? No   Because of a health or memory problem do you have any difficulty with pulling or pushing large objects like a living room chair? No   Because of a health or memory problem do you have any difficulty dressing yourself ? No    Because of a health or memory problem do you have any difficulty with weightlifting- such as transferring yourself to the toilet,clean yourself, or have incontinence of stool or urine? No   Comments:     Instrumental Activities of Daily Living:   In the past 7 days,did you need help from others to take care of things such as laundry and housekeeping, using the telephone, food preparation, transportation, or taking your own medications? No   Comments:         Social/Emotional Support:   During the past 4 weeks, was someone available to help if patient needed and wanted help? Yes   How often does patient get the social and emotional support needed? Yes       Injury Risk:   Do you live alone? No  Are emergency numbers kept by the phone and regularly updated? Yes   Are all household members aware of the dangers of smoking, especially in bed? Yes   Are firearms stored unloaded and securely locked? N/A   Are working smoke alarms and Scientist, forensic available for use? Yes   Do all household members know how to use them? Yes   Are electrical cords in working order, easily seen, and not run under rugs or carpet or wrapped around nails? Yes   Are non slip mats in all bathtubs and showers? No   Do all stairways have a railing or banister? Yes   Are doorways, halls, and stairs free of clutter? Yes   Are there stairs in patients home? Yes   Are sidewalks and all outdoor steps clear of tools, toys, and other articles? Yes   Is there carpet flooring in patient's home? Yes   Are there area rugs in patients home? Yes   Does patient ever feel unsteady when walking? No   Does patient feel dizzy or lightheaded? No   Has patient fallen 2 or more times in the past year? No   What caused patient to fall? No   Is patient afraid of falling? No   Does patient have carbon monoxide detectors in home? No   Does patient have animals in home? No   Does patient drive? Yes   Does patient wear seatbelts? Yes   Does patient feel they can  safely operate a car? Yes       Physical Exam:     Physical Exam   Constitutional: She is oriented to person, place, and time. She appears well-developed and well-nourished.   Neck: Normal range of motion. Neck supple. Carotid bruit is not present.   Cardiovascular: Normal rate, regular rhythm, S1 normal, S2 normal, normal heart sounds, intact distal pulses and normal pulses.  Exam reveals no gallop and no friction rub.    No murmur heard.  Pulmonary/Chest: Effort normal and breath sounds normal.   Abdominal: Soft. Bowel sounds are normal.   Musculoskeletal: Normal range of motion. She exhibits no edema.   Multiple varicosities in LE B/L   Lymphadenopathy:     She has no cervical adenopathy.   Neurological: She is alert and oriented to person, place, and time.   Skin: Skin is warm and dry. No rash noted.   Psychiatric: She has a normal mood and affect. Her behavior is normal. Judgment and thought content normal.           Hearing Evaluation:     Does a hearing problem cause the patient to feel embarrassed when meeting new people? No (0 pts)   Does a hearing problem cause the patient to feel frustrated when talking to members of their family? No (0 pts)   Does patient have difficulty  hearing when someone speaks a whisper? No (0 pts)   Does patient feel handicapped by a hearing problem? No (0 pts)   Does a hearing problem cause the patient difficulty when visiting friends, relatives or neighbors? No (0 pts)   Does a hearing problem cause patient to attend religious services less often than they would like? No (0 pts)   Does a hearing problem cause patient to have arguments with family members? No (0 pts)   Does a hearing problem cause patient difficulty when listening to TV or radio? No (0 pts)   Does patient have difficulty with hearing limits or hampers their personal or social  life? No (0 pts)   Does a hearing problem cause patient difficulty when in a restaurant  with relatives or friends? No (0 pts)      Interpretation of  score:  0-9 suggest no hearing handicap  10-24 suggests mild- moderate hearing handicap  26-40 suggests significant hearing handicap    Assessment     1. Medicare annual wellness visit, subsequent     2. Menopause  Dxa Bone Density Axial Skeleton     Plan:     Return in about 3 months (around 07/07/2015).    Continue current medications  Reviewed Health Maintenance   Ordered Bone Dxa Scan    Scribed for Dr. Beverely Pace, DNP by Dorthula Nettles, Medical Scribe.      I, Dr. Beverely Pace, personally performed the services described in this documentation, as scribed by, Dorthula Nettles, in my presence. I agree with this documentation and It is both accurate and complete.    04/08/2015 3:39 PM

## 2015-04-12 ENCOUNTER — Other Ambulatory Visit (RURAL_HEALTH_CENTER): Payer: Self-pay | Admitting: Family

## 2015-04-12 MED ORDER — LOSARTAN POTASSIUM-HCTZ 50-12.5 MG PO TABS
ORAL_TABLET | ORAL | Status: DC
Start: 2015-04-12 — End: 2015-05-30

## 2015-04-12 MED ORDER — FENOFIBRATE 160 MG PO TABS
160.0000 mg | ORAL_TABLET | Freq: Every day | ORAL | Status: DC
Start: 2015-04-12 — End: 2015-04-12

## 2015-04-12 MED ORDER — POTASSIUM CHLORIDE CRYS ER 20 MEQ PO TBCR
EXTENDED_RELEASE_TABLET | ORAL | Status: DC
Start: 2015-04-12 — End: 2016-03-29

## 2015-04-12 MED ORDER — FENOFIBRATE 160 MG PO TABS
160.0000 mg | ORAL_TABLET | Freq: Every day | ORAL | Status: DC
Start: 2015-04-12 — End: 2016-07-11

## 2015-04-12 MED ORDER — LEVOTHYROXINE SODIUM 112 MCG PO TABS
ORAL_TABLET | ORAL | Status: DC
Start: 2015-04-12 — End: 2016-03-29

## 2015-04-12 MED ORDER — VENLAFAXINE HCL ER 75 MG PO CP24
ORAL_CAPSULE | ORAL | Status: DC
Start: 2015-04-12 — End: 2015-06-16

## 2015-04-12 MED ORDER — LOVASTATIN 20 MG PO TABS
ORAL_TABLET | ORAL | Status: DC
Start: 2015-04-12 — End: 2016-03-29

## 2015-04-12 MED ORDER — DICLOFENAC SODIUM 75 MG PO TBEC
75.0000 mg | DELAYED_RELEASE_TABLET | Freq: Two times a day (BID) | ORAL | Status: DC | PRN
Start: 2015-04-12 — End: 2016-02-24

## 2015-04-12 MED ORDER — PANTOPRAZOLE SODIUM 40 MG PO TBEC
DELAYED_RELEASE_TABLET | ORAL | Status: DC
Start: 2015-04-12 — End: 2016-03-29

## 2015-04-12 NOTE — Telephone Encounter (Signed)
Pt can no longer afford her copay for fenofibric acid 135 mg. Is there an alternative that you know of that would be cheap?//jlm

## 2015-04-12 NOTE — Telephone Encounter (Signed)
Pt needs meds to go to [facility] not [facility]

## 2015-04-12 NOTE — Telephone Encounter (Signed)
Sent a new med to [facility]

## 2015-04-19 ENCOUNTER — Ambulatory Visit
Admission: RE | Admit: 2015-04-19 | Discharge: 2015-04-19 | Disposition: A | Discharge status: Home / Self Care | Payer: Medicare Other | Source: Ambulatory Visit | Attending: Family | Admitting: Family

## 2015-04-19 ENCOUNTER — Telehealth (RURAL_HEALTH_CENTER): Payer: Self-pay | Admitting: Family

## 2015-04-19 ENCOUNTER — Telehealth (RURAL_HEALTH_CENTER): Payer: Self-pay

## 2015-04-19 DIAGNOSIS — M810 Age-related osteoporosis without current pathological fracture: Secondary | ICD-10-CM

## 2015-04-19 DIAGNOSIS — M8588 Other specified disorders of bone density and structure, other site: Secondary | ICD-10-CM | POA: Insufficient documentation

## 2015-04-19 DIAGNOSIS — Z780 Asymptomatic menopausal state: Secondary | ICD-10-CM | POA: Insufficient documentation

## 2015-04-19 MED ORDER — CLONAZEPAM 1 MG PO TABS
1.0000 mg | ORAL_TABLET | Freq: Every evening | ORAL | Status: DC | PRN
Start: 2015-04-19 — End: 2015-10-28

## 2015-04-19 NOTE — Telephone Encounter (Signed)
-----   Message from Beverely Pace, [place] sent at 04/19/2015  4:39 PM EST -----  Osteopenia, bordering on osteoporosis. Will she start on some med? Should also do a Vit D level since last one was in 2015.

## 2015-04-19 NOTE — Telephone Encounter (Signed)
Pt is requesting clonazepam

## 2015-04-19 NOTE — Telephone Encounter (Signed)
Phone busy and unable to L/M//jlm

## 2015-04-20 MED ORDER — IBANDRONATE SODIUM 150 MG PO TABS
150.0000 mg | ORAL_TABLET | ORAL | Status: DC
Start: 2015-04-20 — End: 2015-10-14

## 2015-04-20 NOTE — Telephone Encounter (Signed)
Pt aware. Please send in script to [facility] for the osteopenia medication, also please put in order for the vitamin d bloodwork-ck lpn   Please send to pool-pt wants to know when orders are in for lab-so she can come in and have drawn-ck lpn

## 2015-04-20 NOTE — Telephone Encounter (Signed)
Busy X2//jlm

## 2015-04-20 NOTE — Telephone Encounter (Signed)
Patient aware.

## 2015-04-20 NOTE — Telephone Encounter (Signed)
Done

## 2015-04-21 ENCOUNTER — Other Ambulatory Visit
Admission: RE | Admit: 2015-04-21 | Discharge: 2015-04-21 | Disposition: A | Discharge status: Home / Self Care | Payer: Medicare Other | Source: Ambulatory Visit | Attending: Family | Admitting: Family

## 2015-04-21 DIAGNOSIS — M810 Age-related osteoporosis without current pathological fracture: Secondary | ICD-10-CM

## 2015-04-21 LAB — VITAMIN D,25 OH,TOTAL: Vitamin D 25-Hydroxy: 33 ng/mL (ref 30–80)

## 2015-05-05 ENCOUNTER — Other Ambulatory Visit (RURAL_HEALTH_CENTER): Payer: Self-pay | Admitting: Family

## 2015-05-11 ENCOUNTER — Other Ambulatory Visit (INDEPENDENT_AMBULATORY_CARE_PROVIDER_SITE_OTHER): Payer: Self-pay | Admitting: Internal Medicine

## 2015-05-30 ENCOUNTER — Other Ambulatory Visit (RURAL_HEALTH_CENTER): Payer: Self-pay | Admitting: Family

## 2015-06-16 ENCOUNTER — Other Ambulatory Visit (RURAL_HEALTH_CENTER): Payer: Self-pay | Admitting: Family

## 2015-07-08 ENCOUNTER — Ambulatory Visit (RURAL_HEALTH_CENTER): Payer: Medicare Other | Admitting: Family

## 2015-07-08 ENCOUNTER — Encounter (RURAL_HEALTH_CENTER): Payer: Self-pay | Admitting: Family

## 2015-07-08 VITALS — BP 121/72 | HR 80 | Temp 97.5°F | Resp 16 | Ht 64.0 in | Wt 173.8 lb

## 2015-07-08 DIAGNOSIS — K219 Gastro-esophageal reflux disease without esophagitis: Secondary | ICD-10-CM

## 2015-07-08 DIAGNOSIS — E782 Mixed hyperlipidemia: Secondary | ICD-10-CM

## 2015-07-08 DIAGNOSIS — F3340 Major depressive disorder, recurrent, in remission, unspecified: Secondary | ICD-10-CM

## 2015-07-08 DIAGNOSIS — I10 Essential (primary) hypertension: Secondary | ICD-10-CM

## 2015-07-08 DIAGNOSIS — E039 Hypothyroidism, unspecified: Secondary | ICD-10-CM

## 2015-07-08 DIAGNOSIS — G2581 Restless legs syndrome: Secondary | ICD-10-CM

## 2015-07-08 NOTE — Progress Notes (Signed)
PROGRESS NOTE    Patient Name: Cassandra [name],Cassandra [name]    Subjective  History of Present Illness:   Cassandra [name] is a 80 y.o. female who presents with:  Chief Complaint   Patient presents with   . Hypertension        Cassandra [name] is here today for a check up concerning hypertension, hypothyroidism, and hyperlipidemia. She is doing very well with no new complaints or issues. She reports that Dr. Freida Busman is monitoring her right hip due to pain. She also reports that her restless legs are not getting any better throughout the night. Pt says at least 2 or 3 times throughout the week she has to take a Tylenol PM in addition to her PM medications to help her sleep.       The patient is currently taking Hydrochlorothiazide and Losartan for HTN control. BP in the office today is 121/72. She denies chest pain, chest pressure/discomfort, claudication, dyspnea, exertional chest pressure/discomfort, fatigue, irregular heart beat, lower extremity edema, near-syncope, orthopnea, palpitations, paroxysmal nocturnal dyspnea, syncope and tachypnea.        Patient is compliant with medication and diet with hyperlipidemia. Last labs showed a total cholesterol of 153 and a HDL of 32 on 03/01/15.      The patient is currently taking Levothyroxine 112 mcg daily. The patient denies symptoms of hypothyroidism including fatigue, cold intolerance, dry skin, scalp hair thinning, constipation, depression, weight gain, slowed mentation, menorrhagia and muscle cramps.            The following portions of the patient's history were reviewed and updated as appropriate: allergies, current medications, past family history, past medical history, past social history, past surgical history and problem list.  Problem List:     Patient Active Problem List   Diagnosis   . Other and unspecified hyperlipidemia   . Major depressive disorder, recurrent, in remission   . Unspecified hypothyroidism   . Osteoarthritis of shoulder   . Essential hypertension   .  Onychomycosis of toenail     Medications:     Prior to Admission medications    Medication Sig Start Date End Date Taking? Authorizing Provider   Biotin 5000 MCG Cap Take by mouth daily.    [provider]   Choline Fenofibrate (FENOFIBRIC ACID) 135 MG Capsule Delayed Release TAKE 1 CAPSULE DAILY 05/06/15   Beverely Pace, FNP   ciprofloxacin (CIPRO) 500 MG tablet Take 500 mg by mouth 2 (two) times daily as needed.    [provider]   clonazePAM (KLONOPIN) 1 MG tablet Take 1 tablet (1 mg total) by mouth nightly as needed for Anxiety. 04/19/15   Beverely Pace, FNP   Coenzyme Q10 (CO Q 10) 100 MG Cap Take by mouth daily.    [provider]   COLLAGEN PO Take by mouth daily as needed.       [provider]   diclofenac (VOLTAREN) 75 MG EC tablet Take 1 tablet (75 mg total) by mouth 2 (two) times daily as needed. 04/12/15   Beverely Pace, FNP   diphenhydrAMINE-acetaminophen (TYLENOL PM) 25-500 MG Tab Take 1 tablet by mouth nightly as needed.    [provider]   fenofibrate 160 MG tablet Take 1 tablet (160 mg total) by mouth daily. 04/12/15   Beverely Pace, FNP   GARCINIA CAMBOGIA-CHROMIUM PO Take by mouth. 1556 mg 2 po q am      [provider]   ibandronate (BONIVA) 150 MG tablet Take 1  tablet (150 mg total) by mouth every 30 (thirty) days. 04/20/15 04/19/16  Beverely Pace, FNP   levothyroxine (SYNTHROID, LEVOTHROID) 112 MCG tablet TAKE ONE TABLET BY MOUTH ONCE DAILY 04/12/15   Beverely Pace, FNP   losartan-hydrochlorothiazide [facility]) 50-12.5 MG per tablet TAKE 1 TABLET DAILY 05/30/15   Beverely Pace, FNP   lovastatin (MEVACOR) 20 MG tablet TAKE ONE TABLET BY MOUTH ONCE DAILY FOR CHOLESTEROL 04/12/15   Beverely Pace, FNP   lovastatin (MEVACOR) 20 MG tablet TAKE ONE TABLET BY MOUTH ONCE DAILY FOR  CHOLESTEROL 05/11/15   Graciela Husbands, DO   Misc Natural Products (TUMERSAID PO) Take 500 mg by mouth daily.    [provider]   Multiple Vitamin  (MULTIVITAMIN) tablet Take 1 tablet by mouth daily.    [provider]   oxyCODONE-acetaminophen (PERCOCET) 5-325 MG per tablet every 8 (eight) hours as needed.    01/25/15   [provider]   pantoprazole (PROTONIX) 40 MG tablet TAKE ONE TABLET BY MOUTH ONCE DAILY 04/12/15   Beverely Pace, FNP   Polyethylene Glycol 3350 (MIRALAX PO) Take by mouth daily.    [provider]   potassium chloride (K-DUR,KLOR-CON) 20 MEQ tablet TAKE 1 TABLET DAILY 04/12/15   Beverely Pace, FNP   predniSONE (DELTASONE) 20 MG tablet Take 20 mg by mouth as needed. Taper for [employer]    [provider]   venlafaxine (EFFEXOR-XR) 75 MG 24 hr capsule TAKE 1 CAPSULE DAILY 06/17/15   Beverely Pace, FNP        Review of Systems:   Review of Systems   Constitutional: Negative.    HENT: Negative.    Respiratory: Negative.    Cardiovascular: Negative.    Gastrointestinal: Negative.    Musculoskeletal: Negative.    Skin: Negative.    Psychiatric/Behavioral: Negative.      Physical Exam:      Filed Vitals:    07/08/15 1409   BP: 121/72   Pulse: 80   Temp: 97.5 F (36.4 C)   Resp: 16   SpO2: 94%     Physical Exam   Constitutional: She is oriented to person, place, and time. She appears well-developed and well-nourished.   Neck: Normal range of motion. Neck supple. Carotid bruit is not present.   Cardiovascular: Normal rate, regular rhythm, S1 normal, S2 normal, normal heart sounds, intact distal pulses and normal pulses.  Exam reveals no gallop and no friction rub.    No murmur heard.  Pulmonary/Chest: Effort normal and breath sounds normal.   Abdominal: Soft. Bowel sounds are normal.   Musculoskeletal: Normal range of motion. She exhibits no edema.   Lymphadenopathy:     She has no cervical adenopathy.   Neurological: She is alert and oriented to person, place, and time.   Skin: Skin is warm and dry. No rash noted.   Psychiatric: She has a normal mood and affect. Her behavior is normal. Judgment and thought  content normal.   Nursing note and vitals reviewed.    Assessment:      Encounter Diagnoses   Name Primary?   . Essential hypertension Yes   . Hypothyroidism, unspecified type    . Mixed hyperlipidemia    . Gastroesophageal reflux disease, esophagitis presence not specified    . Recurrent depressive disorder, currently in remission    . RLS (restless legs syndrome)      Plan:   Return in about 6 months (around 01/07/2016).     Continue current medications  Reviewed previous labs     Reviewed Health Maintenance     Investigate when and where vaccinations were given; notify office     Patient education literature given for 1200 calorie diet       By signing my name below, I, Dorthula Nettles, attest that this documentation has been prepared under the direction and in the presence of Beverely Pace, DNP.  Electronically Signed: Dorthula Nettles, Scribe. 07/08/2015 3:02 PM      I, Beverely Pace DNP, personally performed the services described in this documentation. All medical record entries made by the scribe were at my direction and in my presence. I have reviewed the chart and plan instructions and agree that the record reflects my personal performance and is accurate and complete.   Beverely Pace, DNP. 07/08/2015 3:02 PM

## 2015-07-08 NOTE — Progress Notes (Signed)
PROGRESS NOTE    Patient Name: Cassandra [name],Cassandra [name]    Subjective  History of Present Illness:   Cassandra [name] is a 80 y.o. female who presents with:  Chief Complaint   Patient presents with   . Hypertension        Cassandra [name] is here today for a check up concerning hypertension, hypothyroidism, and hyperlipidemia. She is doing very well with no new complaints or issues. She reports that Dr. Freida Busman is monitoring her right hip due to pain. She also reports that her restless legs are not getting any better throughout the night. Pt says at least 2 or 3 times throughout the week she has to take a Tylenol PM in addition to her PM medications to help her sleep.       The patient is currently taking Hydrochlorothiazide and Losartan for HTN control. BP in the office today is 121/72. She denies chest pain, chest pressure/discomfort, claudication, dyspnea, exertional chest pressure/discomfort, fatigue, irregular heart beat, lower extremity edema, near-syncope, orthopnea, palpitations, paroxysmal nocturnal dyspnea, syncope and tachypnea.        Patient is compliant with medication and diet with hyperlipidemia. Last labs showed a total cholesterol of 153 and a HDL of 32 on 03/01/15.      The patient is currently taking Levothyroxine 112 mcg daily. The patient denies symptoms of hypothyroidism including fatigue, cold intolerance, dry skin, scalp hair thinning, constipation, depression, weight gain, slowed mentation, menorrhagia and muscle cramps.            The following portions of the patient's history were reviewed and updated as appropriate: allergies, current medications, past family history, past medical history, past social history, past surgical history and problem list.  Problem List:     Patient Active Problem List   Diagnosis   . Other and unspecified hyperlipidemia   . Major depressive disorder, recurrent, in remission   . Unspecified hypothyroidism   . Osteoarthritis of shoulder   . Essential hypertension   .  Onychomycosis of toenail     Medications:     Prior to Admission medications    Medication Sig Start Date End Date Taking? Authorizing Provider   Biotin 5000 MCG Cap Take by mouth daily.    [provider]   Choline Fenofibrate (FENOFIBRIC ACID) 135 MG Capsule Delayed Release TAKE 1 CAPSULE DAILY 05/06/15   Beverely Pace, FNP   ciprofloxacin (CIPRO) 500 MG tablet Take 500 mg by mouth 2 (two) times daily as needed.    [provider]   clonazePAM (KLONOPIN) 1 MG tablet Take 1 tablet (1 mg total) by mouth nightly as needed for Anxiety. 04/19/15   Beverely Pace, FNP   Coenzyme Q10 (CO Q 10) 100 MG Cap Take by mouth daily.    [provider]   COLLAGEN PO Take by mouth daily as needed.       [provider]   diclofenac (VOLTAREN) 75 MG EC tablet Take 1 tablet (75 mg total) by mouth 2 (two) times daily as needed. 04/12/15   Beverely Pace, FNP   diphenhydrAMINE-acetaminophen (TYLENOL PM) 25-500 MG Tab Take 1 tablet by mouth nightly as needed.    [provider]   fenofibrate 160 MG tablet Take 1 tablet (160 mg total) by mouth daily. 04/12/15   Beverely Pace, FNP   GARCINIA CAMBOGIA-CHROMIUM PO Take by mouth. 1556 mg 2 po q am      [provider]   ibandronate (BONIVA) 150 MG tablet Take 1  tablet (150 mg total) by mouth every 30 (thirty) days. 04/20/15 04/19/16  Beverely Pace, FNP   levothyroxine (SYNTHROID, LEVOTHROID) 112 MCG tablet TAKE ONE TABLET BY MOUTH ONCE DAILY 04/12/15   Beverely Pace, FNP   losartan-hydrochlorothiazide [facility]) 50-12.5 MG per tablet TAKE 1 TABLET DAILY 05/30/15   Beverely Pace, FNP   lovastatin (MEVACOR) 20 MG tablet TAKE ONE TABLET BY MOUTH ONCE DAILY FOR CHOLESTEROL 04/12/15   Beverely Pace, FNP   lovastatin (MEVACOR) 20 MG tablet TAKE ONE TABLET BY MOUTH ONCE DAILY FOR  CHOLESTEROL 05/11/15   Graciela Husbands, DO   Misc Natural Products (TUMERSAID PO) Take 500 mg by mouth daily.    [provider]   Multiple Vitamin  (MULTIVITAMIN) tablet Take 1 tablet by mouth daily.    [provider]   oxyCODONE-acetaminophen (PERCOCET) 5-325 MG per tablet every 8 (eight) hours as needed.    01/25/15   [provider]   pantoprazole (PROTONIX) 40 MG tablet TAKE ONE TABLET BY MOUTH ONCE DAILY 04/12/15   Beverely Pace, FNP   Polyethylene Glycol 3350 (MIRALAX PO) Take by mouth daily.    [provider]   potassium chloride (K-DUR,KLOR-CON) 20 MEQ tablet TAKE 1 TABLET DAILY 04/12/15   Beverely Pace, FNP   predniSONE (DELTASONE) 20 MG tablet Take 20 mg by mouth as needed. Taper for [employer]    [provider]   venlafaxine (EFFEXOR-XR) 75 MG 24 hr capsule TAKE 1 CAPSULE DAILY 06/17/15   Beverely Pace, FNP        Review of Systems:   Review of Systems   Constitutional: Negative.    HENT: Negative.    Respiratory: Negative.    Cardiovascular: Negative.    Gastrointestinal: Negative.    Musculoskeletal: Negative.    Skin: Negative.    Psychiatric/Behavioral: Negative.      Physical Exam:      Filed Vitals:    07/08/15 1409   BP: 121/72   Pulse: 80   Temp: 97.5 F (36.4 C)   Resp: 16   SpO2: 94%     Physical Exam   Constitutional: She is oriented to person, place, and time. She appears well-developed and well-nourished.   Neck: Normal range of motion. Neck supple. Carotid bruit is not present.   Cardiovascular: Normal rate, regular rhythm, S1 normal, S2 normal, normal heart sounds, intact distal pulses and normal pulses.  Exam reveals no gallop and no friction rub.    No murmur heard.  Pulmonary/Chest: Effort normal and breath sounds normal.   Abdominal: Soft. Bowel sounds are normal.   Musculoskeletal: Normal range of motion. She exhibits no edema.   Lymphadenopathy:     She has no cervical adenopathy.   Neurological: She is alert and oriented to person, place, and time.   Skin: Skin is warm and dry. No rash noted.   Psychiatric: She has a normal mood and affect. Her behavior is normal. Judgment and thought  content normal.   Nursing note and vitals reviewed.    Assessment:      Encounter Diagnoses   Name Primary?   . Essential hypertension Yes   . Hypothyroidism, unspecified type    . Mixed hyperlipidemia    . Gastroesophageal reflux disease, esophagitis presence not specified    . Recurrent depressive disorder, currently in remission    . RLS (restless legs syndrome)      Plan:   Return in about 6 months (around 01/07/2016).     Continue current medications  Reviewed previous labs     Reviewed Health Maintenance     Investigate when and where vaccinations were given     Patient education literature given for 1200 calorie diet       By signing my name below, I, Dorthula Nettles, attest that this documentation has been prepared under the direction and in the presence of Beverely Pace, DNP.  Electronically Signed: Dorthula Nettles, Scribe. 07/08/2015 2:49 PM      I, Beverely Pace DNP, personally performed the services described in this documentation. All medical record entries made by the scribe were at my direction and in my presence. I have reviewed the chart and plan instructions and agree that the record reflects my personal performance and is accurate and complete.   Beverely Pace, DNP. 07/08/2015 2:49 PM

## 2015-07-08 NOTE — Patient Instructions (Signed)
MyPlate Worksheet: 1,200 Calories  Your calorie needs are about 1,200 calories a day. Below are the U.S. Department of Agriculture (USDA) guidelines for your daily recommended amount of each food group.  Vegetables  1 cups Fruits  1 cup Grains  4 ounces Dairy  2 cups Protein  3 ounces   Eat a variety of vegetables each day.  Aim for these amounts each week:   1 cup dark green vegetables   3 cups red or orange-colored vegetables   cup dry beans and peas   3 cups starchy vegetables   2 cups other vegetables Eat a variety of fruits each day.  Go easy on fruit juices.  Good choices of fruits include:   Berries   Bananas   Apples   Melon   Dried fruit   Frozen fruit   Canned fruit Choose whole grains whenever you can.  Aim to eat at least 2 ounces of whole grains each day:   Bread   Cereal   Rice   Pasta   Potatoes   Tortillas Choose low-fat or fat-free milk, yogurt, or cheese each day.  Good choices include:   Low-fat or fat-free milk or chocolate milk   Low-fat or fat-free yogurt   Low-fat or fat-free cottage cheese or other reduced-fat cheeses   Calcium-fortified milk alternatives Choose low-fat or lean meats, poultry, fish and seafood each day.  Vary your protein. Choose more:   Fish and other seafood   Lean low-fat meat and poultry   Eggs   Beans, peas   Tofu   Unsalted nuts and seeds  Choose less high-fat and red meat.   Source: USDA MyPlate, www.choosemyplate.gov  Know your limits on oils (fats) and sugars:   Your allowance for oils is 17 grams orabout 4 teaspoons a day (oil includes vegetable oil, mayonnaise, soft margarine, salad dressing, nuts, olives, avocados, and some fish).   Limit the extras (solid fats and sugars, also called "empty calories") to 100 calories a day.   Cut back on salt (sodium). Stay under 2,300 mg sodium a day. If you have a health condition such as heart disease or high blood pressure, your doctor will likely tell you to limit sodium to no more  than 1,500 mg a day.  Get moving and be active!  Aim for at least 30 minutes of physical activity most days of the week or 150 minutes of exercise a week.  MyPlate Servings Worksheet: 1,200 calories  This worksheet tells you how many servings you should get each day from each food group, and tells you how much food makes a serving. Use this as a guide as you plan your meals throughout the day. Track your progress daily by writing in what you actually ate.  Food Group Daily MyPlate Goal What You Ate Today   Vegetables 3 Half-cups or 3 Servings  One serving is:   cup cut-up raw or cooked vegetables  1 cup raw, leafy vegetables   baked sweet potato   cup vegetable juice  Note: At meals, fill half your plate with vegetables and fruit.    Fruits 2 Half-cups or 2 Servings  One serving is:   cup fresh, frozen, or canned fruit  1 medium piece of fruit  1 cup of berries or melon   cup dried fruit   cup 100% fruit juice  Note: Make most choices fruit instead of juice.    Grains 4 Servings or 4 Ounces  One serving is:  1   slice bread  1 cup dry cereal   cup cooked rice, pasta, or cereal  1 5-inch tortilla  Note: Choose whole grains for at least half of your servings each day.    Dairy 2 Servings or 2 Cups  One serving is:  1 cup milk  1 ounces reduced-fat hard cheese  2 ounces processed cheese  1 cup low-fat yogurt  1/3 cup shredded cheese  Note: Choose low-fat or fat-free most often.    Protein 3 Servings or 3 Ounces  One serving is:  1 ounce cooked lean beef, pork, lamb, or ham  1 ounce cooked chicken or turkey (no skin)  1 ounce cooked fish or shellfish (not fried)  1 egg   cup egg substitute   ounce nuts or seeds  1 tablespoon peanut or almond butter   cup cooked dry beans or peas   cup tofu  2 tablespoons hummus    Date Last Reviewed: 08/25/2013   2000-2016 [employer] [address], [place] [address]. All rights reserved. This information is not intended as a substitute for  professional medical care. Always follow your healthcare professional's instructions.

## 2015-07-08 NOTE — Progress Notes (Deleted)
Subjective:    Patient ID: Cassandra [name] is a 80 y.o. female.    HPI    { :[id number]}    Review of Systems        Objective:    Physical Exam        Assessment:       1. Essential hypertension    2. Hypothyroidism, unspecified type    3. Mixed hyperlipidemia    4. Gastroesophageal reflux disease, esophagitis presence not specified    5. Recurrent depressive disorder, currently in remission    6. RLS (restless legs syndrome)          Plan:       ***

## 2015-10-11 ENCOUNTER — Telehealth (RURAL_HEALTH_CENTER): Payer: Self-pay | Admitting: Internal Medicine

## 2015-10-11 NOTE — Telephone Encounter (Signed)
Pt calling. She is having a stinging-burning sensation, severe pain, on her buttocks/anal region. It is very sensitive-no rash. Has been bothering me slowly for several weeks. Usually see's dr. Amada Jupiter or cheryl. Does not want to see dr. Amada Jupiter for this issue ( he is my neighbor-i will see he for anything else -but not this) suggested [facility]- she did not want this either. Wants to see cheryl. Uses [facility]. Can leave message on voice-mail per pt. [phone] lpn

## 2015-10-11 NOTE — Telephone Encounter (Signed)
See below

## 2015-10-11 NOTE — Telephone Encounter (Signed)
4 PM tomorrow

## 2015-10-12 ENCOUNTER — Encounter (RURAL_HEALTH_CENTER): Payer: Self-pay | Admitting: Family

## 2015-10-12 ENCOUNTER — Ambulatory Visit (RURAL_HEALTH_CENTER): Payer: Medicare Other | Admitting: Family

## 2015-10-12 VITALS — BP 112/82 | HR 94 | Temp 98.0°F | Resp 16 | Ht 64.0 in | Wt 173.2 lb

## 2015-10-12 DIAGNOSIS — L900 Lichen sclerosus et atrophicus: Secondary | ICD-10-CM

## 2015-10-12 MED ORDER — BETAMETHASONE DIPROPIONATE 0.05 % EX OINT
TOPICAL_OINTMENT | Freq: Two times a day (BID) | CUTANEOUS | Status: DC
Start: 2015-10-12 — End: 2017-01-11

## 2015-10-12 NOTE — Progress Notes (Signed)
PROGRESS NOTE    Patient Name: Buell,Lilianne ANN    Subjective  History of Present Illness:   Adelynn Maleiyah Releford is a 80 y.o. female who presents with:  Chief Complaint   Patient presents with   . skin     Skin feels like it has been burning for 3 weeks. No rash noted.       Ms Sparger is here with c/o a rash that started approximately 3 weeks ago she started to feel stinging on her buttocks while sitting. She tried several OTC medications to help and reports that Tylenol only gave her relief but never helped the sting. The pain and discomfort was so bad yesterday while at church she had to take her hose off.     The following portions of the patient's history were reviewed and updated as appropriate: allergies, current medications, past family history, past medical history, past social history, past surgical history and problem list.  Problem List:     Patient Active Problem List   Diagnosis   . Other and unspecified hyperlipidemia   . Major depressive disorder, recurrent, in remission   . Unspecified hypothyroidism   . Osteoarthritis of shoulder   . Essential hypertension   . Onychomycosis of toenail      Medications:     Prior to Admission medications    Medication Sig Start Date End Date Taking? Authorizing Provider   Biotin 5000 MCG Cap Take by mouth daily.   Yes [provider]   ciprofloxacin (CIPRO) 500 MG tablet Take 500 mg by mouth 2 (two) times daily as needed.   Yes [provider]   clonazePAM (KLONOPIN) 1 MG tablet Take 1 tablet (1 mg total) by mouth nightly as needed for Anxiety. 04/19/15  Yes Beverely Pace, FNP   Coenzyme Q10 (CO Q 10) 100 MG Cap Take by mouth as needed.      Yes [provider]   COLLAGEN PO Take by mouth daily as needed.      Yes [provider]   diclofenac (VOLTAREN) 75 MG EC tablet Take 1 tablet (75 mg total) by mouth 2 (two) times daily as needed. 04/12/15  Yes Beverely Pace, FNP   diphenhydrAMINE-acetaminophen (TYLENOL PM) 25-500 MG Tab  Take 1 tablet by mouth nightly as needed.   Yes [provider]   fenofibrate 160 MG tablet Take 1 tablet (160 mg total) by mouth daily. 04/12/15  Yes Beverely Pace, FNP   ibandronate (BONIVA) 150 MG tablet Take 1 tablet (150 mg total) by mouth every 30 (thirty) days. 04/20/15 04/19/16 Yes Beverely Pace, FNP   levothyroxine (SYNTHROID, LEVOTHROID) 112 MCG tablet TAKE ONE TABLET BY MOUTH ONCE DAILY 04/12/15  Yes Beverely Pace, FNP   losartan-hydrochlorothiazide (HYZAAR) 50-12.5 MG per tablet TAKE 1 TABLET DAILY 05/30/15  Yes Beverely Pace, FNP   lovastatin (MEVACOR) 20 MG tablet TAKE ONE TABLET BY MOUTH ONCE DAILY FOR CHOLESTEROL 04/12/15  Yes Beverely Pace, FNP   Misc Natural Products (TUMERSAID PO) Take 500 mg by mouth daily.   Yes [provider]   Multiple Vitamin (MULTIVITAMIN) tablet Take 1 tablet by mouth daily.   Yes [provider]   oxyCODONE-acetaminophen (PERCOCET) 5-325 MG per tablet every 8 (eight) hours as needed.    01/25/15  Yes [provider]   pantoprazole (PROTONIX) 40 MG tablet TAKE ONE TABLET BY MOUTH ONCE DAILY 04/12/15  Yes Beverely Pace, FNP   Polyethylene Glycol 3350 (MIRALAX PO) Take by mouth daily.  Yes [provider]   potassium chloride (K-DUR,KLOR-CON) 20 MEQ tablet TAKE 1 TABLET DAILY 04/12/15  Yes Beverely Pace, FNP   venlafaxine (EFFEXOR-XR) 75 MG 24 hr capsule TAKE 1 CAPSULE DAILY 06/17/15  Yes Beverely Pace, FNP        Review of Systems:   Review of Systems      Physical Exam:      Filed Vitals:    10/12/15 1629   BP: 112/82   Pulse: 94   Temp: 98 F (36.7 C)   Resp: 16   SpO2: 95%     Physical Exam   Skin:            Assessment:      Encounter Diagnosis   Name Primary?   . Lichen sclerosus Yes       Plan:   Return in about 2 weeks (around 10/28/2015).     Continue current medications  Prescribed Betamethasone ointment     Avoid wearing underwear while using ointment         By signing my name below, I, Dorthula Nettles, attest  that this documentation has been prepared under the direction and in the presence of Beverely Pace, DNP.  Electronically Signed: Dorthula Nettles, Scribe. 10/12/2015 4:55 PM     I, Beverely Pace DNP, personally performed the services described in this documentation. All medical record entries made by the scribe were at my direction and in my presence. I have reviewed the chart and plan instructions and agree that the record reflects my personal performance and is accurate and complete.   Beverely Pace, DNP. 10/12/2015 4:55 PM

## 2015-10-12 NOTE — Progress Notes (Signed)
PROGRESS NOTE    Patient Name: Cassandra [name]    Subjective  History of Present Illness:   Cassandra [name] is a 80 y.o. female who presents with:  Chief Complaint   Patient presents with   . skin     Skin feels like it has been burning for 3 weeks. No rash noted.       Cassandra [name] is here with c/o a rash that started approximately 3 weeks ago she started to feel stinging on her buttocks while sitting. She tried several OTC medications to help and reports that Tylenol only gave her relief but never helped the sting. The pain and discomfort was so bad yesterday while at church she had to take her hose off.     The following portions of the patient's history were reviewed and updated as appropriate: allergies, current medications, past family history, past medical history, past social history, past surgical history and problem list.  Problem List:     Patient Active Problem List   Diagnosis   . Other and unspecified hyperlipidemia   . Major depressive disorder, recurrent, in remission   . Unspecified hypothyroidism   . Osteoarthritis of shoulder   . Essential hypertension   . Onychomycosis of toenail      Medications:     Prior to Admission medications    Medication Sig Start Date End Date Taking? Authorizing Provider   Biotin 5000 MCG Cap Take by mouth daily.   Yes [provider]   ciprofloxacin (CIPRO) 500 MG tablet Take 500 mg by mouth 2 (two) times daily as needed.   Yes [provider]   clonazePAM (KLONOPIN) 1 MG tablet Take 1 tablet (1 mg total) by mouth nightly as needed for Anxiety. 04/19/15  Yes Beverely Pace, FNP   Coenzyme Q10 (CO Q 10) 100 MG Cap Take by mouth as needed.      Yes [provider]   COLLAGEN PO Take by mouth daily as needed.      Yes [provider]   diclofenac (VOLTAREN) 75 MG EC tablet Take 1 tablet (75 mg total) by mouth 2 (two) times daily as needed. 04/12/15  Yes Beverely Pace, FNP   diphenhydrAMINE-acetaminophen (TYLENOL PM) 25-500 MG Tab  Take 1 tablet by mouth nightly as needed.   Yes [provider]   fenofibrate 160 MG tablet Take 1 tablet (160 mg total) by mouth daily. 04/12/15  Yes Beverely Pace, FNP   ibandronate (BONIVA) 150 MG tablet Take 1 tablet (150 mg total) by mouth every 30 (thirty) days. 04/20/15 04/19/16 Yes Beverely Pace, FNP   levothyroxine (SYNTHROID, LEVOTHROID) 112 MCG tablet TAKE ONE TABLET BY MOUTH ONCE DAILY 04/12/15  Yes Beverely Pace, FNP   losartan-hydrochlorothiazide (HYZAAR) 50-12.5 MG per tablet TAKE 1 TABLET DAILY 05/30/15  Yes Beverely Pace, FNP   lovastatin (MEVACOR) 20 MG tablet TAKE ONE TABLET BY MOUTH ONCE DAILY FOR CHOLESTEROL 04/12/15  Yes Beverely Pace, FNP   Misc Natural Products (TUMERSAID PO) Take 500 mg by mouth daily.   Yes [provider]   Multiple Vitamin (MULTIVITAMIN) tablet Take 1 tablet by mouth daily.   Yes [provider]   oxyCODONE-acetaminophen (PERCOCET) 5-325 MG per tablet every 8 (eight) hours as needed.    01/25/15  Yes [provider]   pantoprazole (PROTONIX) 40 MG tablet TAKE ONE TABLET BY MOUTH ONCE DAILY 04/12/15  Yes Beverely Pace, FNP   Polyethylene Glycol 3350 (MIRALAX PO) Take by mouth daily.  Yes [provider]   potassium chloride (K-DUR,KLOR-CON) 20 MEQ tablet TAKE 1 TABLET DAILY 04/12/15  Yes Beverely Pace, FNP   venlafaxine (EFFEXOR-XR) 75 MG 24 hr capsule TAKE 1 CAPSULE DAILY 06/17/15  Yes Beverely Pace, FNP        Review of Systems:   Review of Systems      Physical Exam:      Filed Vitals:    10/12/15 1629   BP: 112/82   Pulse: 94   Temp: 98 F (36.7 C)   Resp: 16   SpO2: 95%     Physical Exam   Skin:            Assessment:      Encounter Diagnosis   Name Primary?   . Lichen sclerosus Yes       Plan:   Return in about 2 weeks (around 10/28/2015).     Continue current medications  Prescribed Betamethasone ointment     Avoid wearing underwear while using ointment         By signing my name below, I, Dorthula Nettles, attest  that this documentation has been prepared under the direction and in the presence of Beverely Pace, DNP.  Electronically Signed: Dorthula Nettles, Scribe. 10/12/2015 4:55 PM     I, Beverely Pace DNP, personally performed the services described in this documentation. All medical record entries made by the scribe were at my direction and in my presence. I have reviewed the chart and plan instructions and agree that the record reflects my personal performance and is accurate and complete.   Beverely Pace, DNP. 10/12/2015 4:55 PM

## 2015-10-14 ENCOUNTER — Other Ambulatory Visit (RURAL_HEALTH_CENTER): Payer: Self-pay | Admitting: Internal Medicine

## 2015-10-14 MED ORDER — IBANDRONATE SODIUM 150 MG PO TABS
150.0000 mg | ORAL_TABLET | ORAL | Status: DC
Start: 2015-10-14 — End: 2016-12-08

## 2015-10-14 NOTE — Telephone Encounter (Signed)
Patient calling for refill for Boniva, stated it was suppose to have been sent to the pharmacy yesterday but the pharamacy told her they do not have it

## 2015-10-14 NOTE — Telephone Encounter (Signed)
Patient informed. 

## 2015-10-28 ENCOUNTER — Ambulatory Visit (RURAL_HEALTH_CENTER): Payer: Medicare Other | Admitting: Family

## 2015-10-28 ENCOUNTER — Encounter (RURAL_HEALTH_CENTER): Payer: Self-pay | Admitting: Family

## 2015-10-28 VITALS — BP 133/72 | HR 86 | Temp 97.2°F | Resp 16 | Ht 64.0 in | Wt 173.8 lb

## 2015-10-28 DIAGNOSIS — G2581 Restless legs syndrome: Secondary | ICD-10-CM

## 2015-10-28 DIAGNOSIS — L900 Lichen sclerosus et atrophicus: Secondary | ICD-10-CM

## 2015-10-28 HISTORY — DX: Restless legs syndrome: G25.81

## 2015-10-28 MED ORDER — CLONAZEPAM 1 MG PO TABS
1.0000 mg | ORAL_TABLET | Freq: Every evening | ORAL | Status: DC | PRN
Start: 2015-10-28 — End: 2016-01-06

## 2015-10-28 MED ORDER — CLONAZEPAM 1 MG PO TABS
1.0000 mg | ORAL_TABLET | Freq: Every evening | ORAL | Status: DC | PRN
Start: 2015-10-28 — End: 2016-01-31

## 2015-10-28 NOTE — Progress Notes (Signed)
PROGRESS NOTE    Patient Name: Cassandra [name]    Subjective  History of Present Illness:   Cassandra [name] is a 80 y.o. female who presents with:  Chief Complaint   Patient presents with   . Vaginal Pain     75% better. 90 Day and 1 week RX for Clonazepam        Ms Cassandra [name] is here today for a follow up concerning vaginal pain. She reports she has had 75% improvement. She has been compliant with the cream however, she has not used it yet this morning due to her appointment. She has not been wearing underwear as instructed as well.       The following portions of the patient's history were reviewed and updated as appropriate: allergies, current medications, past family history, past medical history, past social history, past surgical history and problem list.       Problem List:     Patient Active Problem List   Diagnosis   . Other and unspecified hyperlipidemia   . Major depressive disorder, recurrent, in remission   . Unspecified hypothyroidism   . Osteoarthritis of shoulder   . Essential hypertension   . Onychomycosis of toenail   . RLS (restless legs syndrome)          Medications:     Prior to Admission medications    Medication Sig Start Date End Date Taking? Authorizing Provider   betamethasone dipropionate (DIPROLENE) 0.05 % ointment Apply topically 2 (two) times daily. 10/12/15  Yes Beverely Pace, FNP   Biotin 5000 MCG Cap Take by mouth daily.   Yes [provider]   ciprofloxacin (CIPRO) 500 MG tablet Take 500 mg by mouth 2 (two) times daily as needed.   Yes [provider]   clonazePAM (KLONOPIN) 1 MG tablet Take 1 tablet (1 mg total) by mouth nightly as needed for Anxiety. 04/19/15  Yes Beverely Pace, FNP   Coenzyme Q10 (CO Q 10) 100 MG Cap Take by mouth as needed.      Yes [provider]   COLLAGEN PO Take by mouth daily as needed.      Yes [provider]   diclofenac (VOLTAREN) 75 MG EC tablet Take 1 tablet (75 mg total) by mouth 2 (two) times daily as  needed. 04/12/15  Yes Beverely Pace, FNP   diphenhydrAMINE-acetaminophen (TYLENOL PM) 25-500 MG Tab Take 1 tablet by mouth nightly as needed.   Yes [provider]   fenofibrate 160 MG tablet Take 1 tablet (160 mg total) by mouth daily. 04/12/15  Yes Beverely Pace, FNP   ibandronate (BONIVA) 150 MG tablet Take 1 tablet (150 mg total) by mouth every 30 (thirty) days. 10/14/15 10/13/16 Yes Beverely Pace, FNP   levothyroxine (SYNTHROID, LEVOTHROID) 112 MCG tablet TAKE ONE TABLET BY MOUTH ONCE DAILY 04/12/15  Yes Beverely Pace, FNP   losartan-hydrochlorothiazide (HYZAAR) 50-12.5 MG per tablet TAKE 1 TABLET DAILY 05/30/15  Yes Beverely Pace, FNP   lovastatin (MEVACOR) 20 MG tablet TAKE ONE TABLET BY MOUTH ONCE DAILY FOR CHOLESTEROL 04/12/15  Yes Beverely Pace, FNP   Misc Natural Products (TUMERSAID PO) Take 500 mg by mouth daily.   Yes [provider]   Multiple Vitamin (MULTIVITAMIN) tablet Take 1 tablet by mouth daily.   Yes [provider]   oxyCODONE-acetaminophen (PERCOCET) 5-325 MG per tablet every 8 (eight) hours as needed.RARE     01/25/15  Yes [provider]   pantoprazole (PROTONIX) 40 MG tablet  TAKE ONE TABLET BY MOUTH ONCE DAILY 04/12/15  Yes Beverely Pace, FNP   Polyethylene Glycol 3350 (MIRALAX PO) Take by mouth daily.   Yes [provider]   potassium chloride (K-DUR,KLOR-CON) 20 MEQ tablet TAKE 1 TABLET DAILY 04/12/15  Yes Beverely Pace, FNP   venlafaxine (EFFEXOR-XR) 75 MG 24 hr capsule TAKE 1 CAPSULE DAILY 06/17/15  Yes Beverely Pace, FNP        Review of Systems:   Review of Systems   Constitutional: Negative.    HENT: Negative.    Respiratory: Negative.    Cardiovascular: Negative.    Gastrointestinal: Negative.    Genitourinary: Positive for vaginal pain.   Musculoskeletal: Negative.    Skin: Positive for rash.   Psychiatric/Behavioral: Negative.          Physical Exam:      Filed Vitals:    10/28/15 0940   BP: 133/72   Pulse: 86   Temp: 97.2 F  (36.2 C)   Resp: 16   SpO2: 95%   Physical Exam   Genitourinary: There is tenderness in the vagina.   Skin: Rash noted.   There continues to be some irritation in the gluteal fold      Assessment:      Encounter Diagnoses   Name Primary?   . Lichen sclerosus    . RLS (restless legs syndrome) Yes       Plan:   Keep 01/06/16 Appointment     Continue current medications   Reordered Clonazepam 1 mg 1 tab PO HS   Prescribed Clonazepam 1 mg 1 tab PO HS 14 tabs until regular Rx arrives    Ambulatory referral to GYN with Bobbi Cherrywell        By signing my name below, I, Dorthula Nettles, attest that this documentation has been prepared under the direction and in the presence of Beverely Pace, DNP.  Electronically Signed: Dorthula Nettles, Scribe. 10/28/2015 10:26 AM      I, Beverely Pace DNP, personally performed the services described in this documentation. All medical record entries made by the scribe were at my direction and in my presence. I have reviewed the chart and plan instructions and agree that the record reflects my personal performance and is accurate and complete.   Beverely Pace, DNP. 10/28/2015 10:26 AM

## 2015-10-28 NOTE — Progress Notes (Signed)
PROGRESS NOTE    Patient Name: Cassandra [name],Cassandra [name]    Subjective  History of Present Illness:   Cassandra [name] is a 80 y.o. female who presents with:  Chief Complaint   Patient presents with   . Vaginal Pain     75% better. 90 Day and 1 week RX for Clonazepam        Ms Pinette is here today for a follow up concerning vaginal pain. She reports she has had 75% improvement. She has been compliant with the cream however, she has not used it yet this morning due to her appointment. She has not been wearing underwear as instructed as well.       The following portions of the patient's history were reviewed and updated as appropriate: allergies, current medications, past family history, past medical history, past social history, past surgical history and problem list.       Problem List:     Patient Active Problem List   Diagnosis   . Other and unspecified hyperlipidemia   . Major depressive disorder, recurrent, in remission   . Unspecified hypothyroidism   . Osteoarthritis of shoulder   . Essential hypertension   . Onychomycosis of toenail   . RLS (restless legs syndrome)          Medications:     Prior to Admission medications    Medication Sig Start Date End Date Taking? Authorizing Provider   betamethasone dipropionate (DIPROLENE) 0.05 % ointment Apply topically 2 (two) times daily. 10/12/15  Yes Beverely Pace, FNP   Biotin 5000 MCG Cap Take by mouth daily.   Yes [provider]   ciprofloxacin (CIPRO) 500 MG tablet Take 500 mg by mouth 2 (two) times daily as needed.   Yes [provider]   clonazePAM (KLONOPIN) 1 MG tablet Take 1 tablet (1 mg total) by mouth nightly as needed for Anxiety. 04/19/15  Yes Beverely Pace, FNP   Coenzyme Q10 (CO Q 10) 100 MG Cap Take by mouth as needed.      Yes [provider]   COLLAGEN PO Take by mouth daily as needed.      Yes [provider]   diclofenac (VOLTAREN) 75 MG EC tablet Take 1 tablet (75 mg total) by mouth 2 (two) times daily as  needed. 04/12/15  Yes Beverely Pace, FNP   diphenhydrAMINE-acetaminophen (TYLENOL PM) 25-500 MG Tab Take 1 tablet by mouth nightly as needed.   Yes [provider]   fenofibrate 160 MG tablet Take 1 tablet (160 mg total) by mouth daily. 04/12/15  Yes Beverely Pace, FNP   ibandronate (BONIVA) 150 MG tablet Take 1 tablet (150 mg total) by mouth every 30 (thirty) days. 10/14/15 10/13/16 Yes Beverely Pace, FNP   levothyroxine (SYNTHROID, LEVOTHROID) 112 MCG tablet TAKE ONE TABLET BY MOUTH ONCE DAILY 04/12/15  Yes Beverely Pace, FNP   losartan-hydrochlorothiazide (HYZAAR) 50-12.5 MG per tablet TAKE 1 TABLET DAILY 05/30/15  Yes Beverely Pace, FNP   lovastatin (MEVACOR) 20 MG tablet TAKE ONE TABLET BY MOUTH ONCE DAILY FOR CHOLESTEROL 04/12/15  Yes Beverely Pace, FNP   Misc Natural Products (TUMERSAID PO) Take 500 mg by mouth daily.   Yes [provider]   Multiple Vitamin (MULTIVITAMIN) tablet Take 1 tablet by mouth daily.   Yes [provider]   oxyCODONE-acetaminophen (PERCOCET) 5-325 MG per tablet every 8 (eight) hours as needed.RARE     01/25/15  Yes [provider]   pantoprazole (PROTONIX) 40 MG tablet  TAKE ONE TABLET BY MOUTH ONCE DAILY 04/12/15  Yes Beverely Pace, FNP   Polyethylene Glycol 3350 (MIRALAX PO) Take by mouth daily.   Yes [provider]   potassium chloride (K-DUR,KLOR-CON) 20 MEQ tablet TAKE 1 TABLET DAILY 04/12/15  Yes Beverely Pace, FNP   venlafaxine (EFFEXOR-XR) 75 MG 24 hr capsule TAKE 1 CAPSULE DAILY 06/17/15  Yes Beverely Pace, FNP        Review of Systems:   Review of Systems   Constitutional: Negative.    HENT: Negative.    Respiratory: Negative.    Cardiovascular: Negative.    Gastrointestinal: Negative.    Genitourinary: Positive for vaginal pain.   Musculoskeletal: Negative.    Skin: Positive for rash.   Psychiatric/Behavioral: Negative.          Physical Exam:      Filed Vitals:    10/28/15 0940   BP: 133/72   Pulse: 86   Temp: 97.2 F  (36.2 C)   Resp: 16   SpO2: 95%   Physical Exam   Genitourinary: There is tenderness in the vagina.   Skin: Rash noted.   There continues to be some irritation in the gluteal fold      Assessment:      Encounter Diagnoses   Name Primary?   . Lichen sclerosus    . RLS (restless legs syndrome) Yes       Plan:   Keep 01/06/16 Appointment     Continue current medications   Reordered Clonazepam 1 mg 1 tab PO HS   Prescribed Clonazepam 1 mg 1 tab PO HS 14 tabs until regular Rx arrives    Ambulatory referral to GYN with Bobbi Cherrywell        By signing my name below, I, Dorthula Nettles, attest that this documentation has been prepared under the direction and in the presence of Beverely Pace, DNP.  Electronically Signed: Dorthula Nettles, Scribe. 10/28/2015 10:22 AM      I, Beverely Pace DNP, personally performed the services described in this documentation. All medical record entries made by the scribe were at my direction and in my presence. I have reviewed the chart and plan instructions and agree that the record reflects my personal performance and is accurate and complete.   Beverely Pace, DNP. 10/28/2015 10:22 AM

## 2015-12-27 ENCOUNTER — Encounter (RURAL_HEALTH_CENTER): Payer: Self-pay | Admitting: Internal Medicine

## 2015-12-27 NOTE — Progress Notes (Signed)
Received notification pt received HD flu on 12/23/2015.Marland KitchenMarland Kitchenpkt

## 2015-12-30 ENCOUNTER — Other Ambulatory Visit (RURAL_HEALTH_CENTER): Payer: Self-pay | Admitting: Internal Medicine

## 2016-01-06 ENCOUNTER — Encounter (RURAL_HEALTH_CENTER): Payer: Self-pay | Admitting: Family

## 2016-01-06 ENCOUNTER — Ambulatory Visit (RURAL_HEALTH_CENTER): Payer: Medicare Other | Admitting: Family

## 2016-01-06 DIAGNOSIS — F3340 Major depressive disorder, recurrent, in remission, unspecified: Secondary | ICD-10-CM

## 2016-01-06 DIAGNOSIS — I10 Essential (primary) hypertension: Secondary | ICD-10-CM

## 2016-01-06 DIAGNOSIS — E782 Mixed hyperlipidemia: Secondary | ICD-10-CM

## 2016-01-06 DIAGNOSIS — Z23 Encounter for immunization: Secondary | ICD-10-CM

## 2016-01-06 DIAGNOSIS — E039 Hypothyroidism, unspecified: Secondary | ICD-10-CM

## 2016-01-06 NOTE — Progress Notes (Signed)
Subjective:    Patient ID: Cassandra [name] is a 80 y.o. female.    Cassandra [name] is here today for a 6 month medical management follow up concerning multiple chronic issues. She is doing well with no new complaints or issues. She continues to struggle with a fungal toe on her third toe of her left foot.     The patient is currently taking Hydrochlorothiazide and Cozaar for HTN control. BP in the office today is 120/72. She denies chest pain, chest pressure/discomfort, claudication, dyspnea, exertional chest pressure/discomfort, fatigue, irregular heart beat, lower extremity edema, near-syncope, orthopnea, palpitations, paroxysmal nocturnal dyspnea, syncope and tachypnea.        The patient is currently taking Levothyroxine 112 mcg daily. The patient denies fatigue, cold intolerance, dry skin, scalp hair thinning, constipation, depression, weight gain, slowed mentation, menorrhagia and muscle cramps.              The following portions of the patient's history were reviewed and updated as appropriate: allergies, current medications, past family history, past medical history, past social history, past surgical history and problem list.    Review of Systems   Constitutional: Negative.    HENT: Negative.    Respiratory: Negative.    Cardiovascular: Negative.    Gastrointestinal: Negative.    Musculoskeletal: Negative.    Skin: Negative.    Psychiatric/Behavioral: Negative.            Objective:    Physical Exam   Constitutional: She is oriented to person, place, and time. She appears well-developed and well-nourished.   Neck: Normal range of motion. Neck supple. Carotid bruit is not present.   Cardiovascular: Normal rate, regular rhythm, S1 normal, S2 normal, normal heart sounds, intact distal pulses and normal pulses.  Exam reveals no gallop and no friction rub.    No murmur heard.  Pulmonary/Chest: Effort normal and breath sounds normal.   Abdominal: Soft. Bowel sounds are normal.   Musculoskeletal: Normal range of  motion. She exhibits no edema.   Lymphadenopathy:     She has no cervical adenopathy.   Neurological: She is alert and oriented to person, place, and time.   Skin: Skin is warm and dry. No rash noted.   Psychiatric: She has a normal mood and affect. Her behavior is normal. Judgment and thought content normal.   Nursing note and vitals reviewed.          Assessment:       1. Essential hypertension    2. Hypothyroidism, unspecified type    3. Mixed hyperlipidemia    4. Major depressive disorder, recurrent, in remission    5. Need for vaccination with 13-polyvalent pneumococcal conjugate vaccine    6. Need for diphtheria-tetanus-pertussis (Tdap) vaccine          Plan:       Return in about 6 months (around 07/06/2016).     Continue current medications    Keep toe nails filed     Reviewed Health Maintenance   Ordered and administered Tdap   Ordered and administered Prevnar 13     Reviewed most recent blood labs   Ordered CBC, CMP, Lipid Panel, TSH reflex, Free T3- Will come in fasting one morning to have drawn        By signing my name below, I, Dorthula Nettles, attest that this documentation has been prepared under the direction and in the presence of Beverely Pace, DNP.  Electronically Signed: Dorthula Nettles, Scribe. 01/06/2016 2:23 PM  Mallie Mussel DNP, personally performed the services described in this documentation. All medical record entries made by the scribe were at my direction and in my presence. I have reviewed the chart and plan instructions and agree that the record reflects my personal performance and is accurate and complete.   Beverely Pace, DNP. 01/06/2016 2:23 PM

## 2016-01-06 NOTE — Progress Notes (Signed)
Subjective:    Patient ID: Cassandra [name] is a 80 y.o. female.    Cassandra [name] is here today for a 6 month medical management follow up concerning multiple chronic issues. She is doing well with no new complaints or issues. She continues to struggle with a fungal toe on her third toe of her left foot.     The patient is currently taking Hydrochlorothiazide and Cozaar for HTN control. BP in the office today is 120/72. She denies chest pain, chest pressure/discomfort, claudication, dyspnea, exertional chest pressure/discomfort, fatigue, irregular heart beat, lower extremity edema, near-syncope, orthopnea, palpitations, paroxysmal nocturnal dyspnea, syncope and tachypnea.        The patient is currently taking Levothyroxine 112 mcg daily. The patient denies fatigue, cold intolerance, dry skin, scalp hair thinning, constipation, depression, weight gain, slowed mentation, menorrhagia and muscle cramps.              The following portions of the patient's history were reviewed and updated as appropriate: allergies, current medications, past family history, past medical history, past social history, past surgical history and problem list.    Review of Systems   Constitutional: Negative.    HENT: Negative.    Respiratory: Negative.    Cardiovascular: Negative.    Gastrointestinal: Negative.    Musculoskeletal: Negative.    Skin: Negative.    Psychiatric/Behavioral: Negative.            Objective:    Physical Exam   Constitutional: She is oriented to person, place, and time. She appears well-developed and well-nourished.   Neck: Normal range of motion. Neck supple. Carotid bruit is not present.   Cardiovascular: Normal rate, regular rhythm, S1 normal, S2 normal, normal heart sounds, intact distal pulses and normal pulses.  Exam reveals no gallop and no friction rub.    No murmur heard.  Pulmonary/Chest: Effort normal and breath sounds normal.   Abdominal: Soft. Bowel sounds are normal.   Musculoskeletal: Normal range of  motion. She exhibits no edema.   Lymphadenopathy:     She has no cervical adenopathy.   Neurological: She is alert and oriented to person, place, and time.   Skin: Skin is warm and dry. No rash noted.   Psychiatric: She has a normal mood and affect. Her behavior is normal. Judgment and thought content normal.   Nursing note and vitals reviewed.          Assessment:       1. Essential hypertension    2. Hypothyroidism, unspecified type    3. Mixed hyperlipidemia    4. Major depressive disorder, recurrent, in remission    5. Need for vaccination with 13-polyvalent pneumococcal conjugate vaccine    6. Need for diphtheria-tetanus-pertussis (Tdap) vaccine          Plan:       Return in about 6 months (around 07/06/2016).     Continue current medications    Keep toe nails filed     Reviewed Health Maintenance   Ordered and administered Tdap   Ordered and administered Prevnar 13     Reviewed most recent blood labs   Ordered CBC, CMP, Lipid Panel, TSH reflex, Free T3 (will be done at a later date while fasting)        By signing my name below, I, Dorthula Nettles, attest that this documentation has been prepared under the direction and in the presence of Beverely Pace, DNP.  Electronically Signed: Dorthula Nettles, Scribe. 01/06/2016 2:21 PM  Mallie Mussel DNP, personally performed the services described in this documentation. All medical record entries made by the scribe were at my direction and in my presence. I have reviewed the chart and plan instructions and agree that the record reflects my personal performance and is accurate and complete.   Beverely Pace, DNP. 01/06/2016 2:21 PM

## 2016-01-31 ENCOUNTER — Other Ambulatory Visit (RURAL_HEALTH_CENTER): Payer: Self-pay | Admitting: Family

## 2016-02-24 ENCOUNTER — Telehealth (RURAL_HEALTH_CENTER): Payer: Self-pay | Admitting: Internal Medicine

## 2016-02-24 ENCOUNTER — Other Ambulatory Visit (RURAL_HEALTH_CENTER): Payer: Self-pay

## 2016-02-24 MED ORDER — DICLOFENAC SODIUM 75 MG PO TBEC
75.0000 mg | DELAYED_RELEASE_TABLET | Freq: Two times a day (BID) | ORAL | 1 refills | Status: DC | PRN
Start: 2016-02-24 — End: 2016-07-11

## 2016-02-24 NOTE — Telephone Encounter (Signed)
Patient needs her Diclofenac sent to her mail away pharmacy, [place].

## 2016-03-08 ENCOUNTER — Other Ambulatory Visit
Admission: RE | Admit: 2016-03-08 | Discharge: 2016-03-08 | Disposition: A | Discharge status: Home / Self Care | Payer: Medicare Other | Source: Ambulatory Visit | Attending: Family | Admitting: Family

## 2016-03-08 DIAGNOSIS — E782 Mixed hyperlipidemia: Secondary | ICD-10-CM

## 2016-03-08 DIAGNOSIS — E039 Hypothyroidism, unspecified: Secondary | ICD-10-CM

## 2016-03-08 DIAGNOSIS — I10 Essential (primary) hypertension: Secondary | ICD-10-CM

## 2016-03-08 LAB — COMPREHENSIVE METABOLIC PANEL
ALT: 17 U/L (ref 0–55)
AST (SGOT): 22 U/L (ref 10–42)
Albumin/Globulin Ratio: 1.19 Ratio (ref 0.70–1.50)
Albumin: 3.8 gm/dL (ref 3.5–5.0)
Alkaline Phosphatase: 46 U/L (ref 40–145)
Anion Gap: 16.7 mMol/L (ref 7.0–18.0)
BUN / Creatinine Ratio: 24.8 Ratio (ref 10.0–30.0)
BUN: 30 mg/dL — ABNORMAL HIGH (ref 7–22)
Bilirubin, Total: 0.4 mg/dL (ref 0.1–1.2)
CO2: 22.6 mMol/L (ref 20.0–30.0)
Calcium: 9.6 mg/dL (ref 8.5–10.5)
Chloride: 107 mMol/L (ref 98–110)
Creatinine: 1.21 mg/dL — ABNORMAL HIGH (ref 0.60–1.20)
EGFR: 43 mL/min/{1.73_m2} — ABNORMAL LOW (ref 60–150)
Globulin: 3.2 gm/dL (ref 2.0–4.0)
Glucose: 91 mg/dL (ref 70–99)
Osmolality Calc: 289 mOsm/kg (ref 275–300)
Potassium: 4.3 mMol/L (ref 3.5–5.3)
Protein, Total: 7 gm/dL (ref 6.0–8.3)
Sodium: 142 mMol/L (ref 136–147)

## 2016-03-08 LAB — CBC AND DIFFERENTIAL
Basophils %: 0.5 % (ref 0.0–3.0)
Basophils Absolute: 0 10*3/uL (ref 0.0–0.3)
Eosinophils %: 2.4 % (ref 0.0–7.0)
Eosinophils Absolute: 0.2 10*3/uL (ref 0.0–0.8)
Hematocrit: 39 % (ref 36.0–48.0)
Hemoglobin: 12.8 gm/dL (ref 12.0–16.0)
Lymphocytes Absolute: 1.6 10*3/uL (ref 0.6–5.1)
Lymphocytes: 24.9 % (ref 15.0–46.0)
MCH: 30 pg (ref 28–35)
MCHC: 33 gm/dL (ref 32–36)
MCV: 91 fL (ref 80–100)
MPV: 8.5 fL (ref 6.0–10.0)
Monocytes Absolute: 0.5 10*3/uL (ref 0.1–1.7)
Monocytes: 8.3 % (ref 3.0–15.0)
Neutrophils %: 64 % (ref 42.0–78.0)
Neutrophils Absolute: 4 10*3/uL (ref 1.7–8.6)
PLT CT: 316 10*3/uL (ref 130–440)
RBC: 4.27 10*6/uL (ref 3.80–5.00)
RDW: 12.4 % (ref 11.0–14.0)
WBC: 6.3 10*3/uL (ref 4.0–11.0)

## 2016-03-08 LAB — THYROID STIMULATING HORMONE (TSH), REFLEX ON ABNORMAL TO FREE T4, SERUM: TSH: 0.58 u[IU]/mL (ref 0.40–4.20)

## 2016-03-08 LAB — T3, FREE: T3, Free: 1.2 pg/mL — ABNORMAL LOW (ref 1.71–3.71)

## 2016-03-08 LAB — LIPID PANEL
Cholesterol: 156 mg/dL (ref 75–199)
Coronary Heart Disease Risk: 4.59
HDL: 34 mg/dL — ABNORMAL LOW (ref 45–65)
LDL Calculated: 95 mg/dL
Triglycerides: 135 mg/dL (ref 10–150)
VLDL: 27 (ref 0–40)

## 2016-03-29 ENCOUNTER — Other Ambulatory Visit (RURAL_HEALTH_CENTER): Payer: Self-pay | Admitting: Family

## 2016-07-11 ENCOUNTER — Encounter (RURAL_HEALTH_CENTER): Payer: Self-pay | Admitting: Family

## 2016-07-11 ENCOUNTER — Ambulatory Visit: Payer: Medicare Other | Attending: Family | Admitting: Family

## 2016-07-11 DIAGNOSIS — E782 Mixed hyperlipidemia: Secondary | ICD-10-CM

## 2016-07-11 DIAGNOSIS — I10 Essential (primary) hypertension: Secondary | ICD-10-CM

## 2016-07-11 DIAGNOSIS — F419 Anxiety disorder, unspecified: Secondary | ICD-10-CM

## 2016-07-11 DIAGNOSIS — E039 Hypothyroidism, unspecified: Secondary | ICD-10-CM

## 2016-07-11 MED ORDER — FENOFIBRATE 160 MG PO TABS
160.0000 mg | ORAL_TABLET | Freq: Every day | ORAL | 3 refills | Status: DC
Start: 2016-07-11 — End: 2017-05-18

## 2016-07-11 MED ORDER — CLONAZEPAM 1 MG PO TABS
ORAL_TABLET | ORAL | 1 refills | Status: DC
Start: 2016-07-11 — End: 2016-07-11

## 2016-07-11 MED ORDER — CLONAZEPAM 1 MG PO TABS
ORAL_TABLET | ORAL | 1 refills | Status: DC
Start: 2016-07-11 — End: 2016-12-27

## 2016-07-11 NOTE — Progress Notes (Signed)
Subjective:    Patient ID: Cassandra [name] is a 81 y.o. female.     Cassandra [name] is a 81 yo female who presents with hyperlipidemia. Pt is in the office today for a 6 month medical management follow up concerning hypertension, hypothyroidism, and hyperlipidemia. Pt reports that she was recently seen by Dr. Freida Busman due to left hip pain. Xrays were ordered and Pt reports that resulted normally. She reports that Dr. Freida Busman ordered physical therapy due to IT band issues and was instructed to take OTC Aleve 2 tabs BID. She is scheduled to see Dr. Freida Busman next week to discuss taking hardware out of her left ankle that was placed after a break in the 1950's. She also would like to discuss removal of a skin tag on her chest that itches, gets caught in her clothing and jewelry, and stays irritated.     Patient is compliant with medication and diet with hyperlipidemia. Last labs showed a total cholesterol of 156 and a HDL of 34 on 03/08/16.    The patient is currently taking Hydrochlorothiazide and Cozaar for HTN control. BP in the office today is 127/69. She denies chest pain, chest pressure/discomfort, claudication, dyspnea, exertional chest pressure/discomfort, fatigue, irregular heart beat, lower extremity edema, near-syncope, orthopnea, palpitations, paroxysmal nocturnal dyspnea, syncope and tachypnea.    The patient is currently taking Levothyroxine 112 mcg daily. The patient denies fatigue, cold intolerance, dry skin, scalp hair thinning, constipation, depression, weight gain, slowed mentation, menorrhagia and muscle cramps.      The following portions of the patient's history were reviewed and updated as appropriate: allergies, current medications, past family history, past medical history, past social history, past surgical history and problem list.    Review of Systems   Constitutional: Negative.    HENT: Negative.    Respiratory: Negative.    Cardiovascular: Negative.    Gastrointestinal: Negative.    Genitourinary:  Negative.    Musculoskeletal: Negative.    Skin:        Skin tag on anterior chest    Hematological: Negative.    Psychiatric/Behavioral: Negative.      Objective:   Physical Exam   Constitutional: She is oriented to person, place, and time. Vital signs are normal. She appears well-developed and well-nourished.   Neck: Normal range of motion. Neck supple. Carotid bruit is not present.   Cardiovascular: Normal rate, regular rhythm, S1 normal, S2 normal, normal heart sounds, intact distal pulses and normal pulses.  Exam reveals no gallop and no friction rub.    No murmur heard.  Pulmonary/Chest: Effort normal and breath sounds normal.   Abdominal: Soft. Bowel sounds are normal.   Musculoskeletal: Normal range of motion. She exhibits no edema.   Lymphadenopathy:     She has no cervical adenopathy.   Neurological: She is alert and oriented to person, place, and time.   Skin: Skin is warm and dry. No rash noted.        Skin tag on anterior chest that has evidence of irritation associated with her clothing    Psychiatric: She has a normal mood and affect. Her behavior is normal. Judgment and thought content normal.   PHQ 2-9 Depression Screen Score = 0    Nursing note and vitals reviewed.    Assessment:     1. Essential hypertension    2. Mixed hyperlipidemia    3. Hypothyroidism, unspecified type    4. Anxiety      Plan:     Return  in about 6 months (around 01/10/2017).    - Skin tag removal to be scheduled in the near future     Continue current medications  Hold Diclofenac while taking OTC Aleve  Reordered Fenofibrate 160 mg     Reviewed Health Maintenance  Fall risk assessment complete   Depression Screen complete     Reviewed most recent blood work - labs will be drawn to further evaluate status of chronic illnesses at next visit      By signing my name below, I, Dorthula Nettles, attest that this documentation has been prepared under the direction and in the presence of Beverely Pace, DNP.  Electronically Signed:  Dorthula Nettles, Scribe. 07/11/2016 1:55 PM     I, Beverely Pace DNP, personally performed the services described in this documentation. All medical record entries made by the scribe were at my direction and in my presence. I have reviewed the chart and plan instructions and agree that the record reflects my personal performance and is accurate and complete.   Beverely Pace, DNP. 07/11/2016 1:55 PM

## 2016-07-13 ENCOUNTER — Other Ambulatory Visit (RURAL_HEALTH_CENTER): Payer: Self-pay | Admitting: Internal Medicine

## 2016-07-17 ENCOUNTER — Ambulatory Visit: Payer: Medicare Other | Attending: Family | Admitting: Family

## 2016-07-17 ENCOUNTER — Encounter (RURAL_HEALTH_CENTER): Payer: Self-pay | Admitting: Family

## 2016-07-17 DIAGNOSIS — L989 Disorder of the skin and subcutaneous tissue, unspecified: Secondary | ICD-10-CM

## 2016-07-17 HISTORY — DX: Disorder of the skin and subcutaneous tissue, unspecified: L98.9

## 2016-07-17 MED ORDER — LIDOCAINE-EPINEPHRINE 1 %-1:100000 IJ SOLN
0.2000 mL | Freq: Once | INTRAMUSCULAR | Status: AC
Start: 2016-07-17 — End: 2016-07-17
  Administered 2016-07-17: 0.2 mL

## 2016-07-17 NOTE — Progress Notes (Signed)
Subjective:    Patient ID: Cassandra [name] is a 81 y.o. female.        Cassandra [name] is a 61 you female who is in the office today with c/o of a suspicious lesion on her anterior chest. The lesion has become sore and itchy at times. Pt also reports that her clothes causes irritation.         The following portions of the patient's history were reviewed and updated as appropriate: allergies, current medications, past family history, past medical history, past social history, past surgical history and problem list.    Review of Systems   Constitutional: Negative.    HENT: Negative.    Respiratory: Negative.    Cardiovascular: Negative.    Gastrointestinal: Negative.    Musculoskeletal: Negative.    Skin:        lesion   Psychiatric/Behavioral: Negative.            Objective:    Physical Exam   Constitutional: She is oriented to person, place, and time. She appears well-developed and well-nourished.   Neck: Normal range of motion. Neck supple. Carotid bruit is not present.   Cardiovascular: Normal rate, regular rhythm, S1 normal, S2 normal, normal heart sounds, intact distal pulses and normal pulses.  Exam reveals no gallop and no friction rub.    No murmur heard.  Pulmonary/Chest: Effort normal and breath sounds normal.   Abdominal: Soft. Bowel sounds are normal.   Musculoskeletal: Normal range of motion. She exhibits no edema.   Lymphadenopathy:     She has no cervical adenopathy.   Neurological: She is alert and oriented to person, place, and time.   Skin: Skin is warm and dry. Lesion noted. No rash noted.   symptomatic lesion on the anterior chest    Psychiatric: She has a normal mood and affect. Her behavior is normal. Judgment and thought content normal.     Assessment:       1. Symptomatic lesion of skin      Plan:       Return in about 6 months (around 01/11/2017).    Continue current medications  Ordered Xylocaine NDC - 47829-562-13    Procedure:  Skin tags are snipped off using alcohol prep for cleansing and  sterile 11 blade without incident. Local anesthesia 0.22 cc of 1 % Xylocaine with Epi was used. These pathognomonic lesion was sent for pathology. Bandaid was applied and care instructions were provided.         By signing my name below, I, Cassandra [name], attest that this documentation has been prepared under the direction and in the presence of Cassandra Pace, DNP.  Electronically Signed: Dorthula [name], Scribe. 07/17/2016 3:35 PM     I, Cassandra Pace DNP, personally performed the services described in this documentation. All medical record entries made by the scribe were at my direction and in my presence. I have reviewed the chart and plan instructions and agree that the record reflects my personal performance and is accurate and complete.   Cassandra Pace, DNP. 07/17/2016 3:35 PM

## 2016-07-17 NOTE — Progress Notes (Signed)
Subjective:    Patient ID: Cassandra [name] is a 81 y.o. female.        [name] is a 29 you female who is in the office today with c/o of a suspicious lesion on her anterior chest. The lesion has become sore and itchy at times. Pt also reports that her clothes causes irritation.         The following portions of the patient's history were reviewed and updated as appropriate: allergies, current medications, past family history, past medical history, past social history, past surgical history and problem list.    Review of Systems   Constitutional: Negative.    HENT: Negative.    Respiratory: Negative.    Cardiovascular: Negative.    Gastrointestinal: Negative.    Musculoskeletal: Negative.    Skin:        lesion   Psychiatric/Behavioral: Negative.            Objective:    Physical Exam   Constitutional: She is oriented to person, place, and time. She appears well-developed and well-nourished.   Neck: Normal range of motion. Neck supple. Carotid bruit is not present.   Cardiovascular: Normal rate, regular rhythm, S1 normal, S2 normal, normal heart sounds, intact distal pulses and normal pulses.  Exam reveals no gallop and no friction rub.    No murmur heard.  Pulmonary/Chest: Effort normal and breath sounds normal.   Abdominal: Soft. Bowel sounds are normal.   Musculoskeletal: Normal range of motion. She exhibits no edema.   Lymphadenopathy:     She has no cervical adenopathy.   Neurological: She is alert and oriented to person, place, and time.   Skin: Skin is warm and dry. Lesion noted. No rash noted.   symptomatic lesion on the anterior chest    Psychiatric: She has a normal mood and affect. Her behavior is normal. Judgment and thought content normal.     Assessment:       1. Symptomatic lesion of skin      Plan:       Return in about 6 months (around 01/11/2017).    Continue current medications  Ordered Xylocaine NDC - 16109-604-54    Procedure:  Skin tags are snipped off using alcohol prep for cleansing and  sterile 11 blade without incident. Local anesthesia 0.22 cc of 1 % Xylocaine with Epi was used. These pathognomonic lesion was sent for pathology. Bandaid was applied and care instructions were provided.         By signing my name below, I, Dorthula Nettles, attest that this documentation has been prepared under the direction and in the presence of Beverely Pace, DNP.  Electronically Signed: Dorthula Nettles, Scribe. 07/17/2016 3:46 PM     I, Beverely Pace DNP, personally performed the services described in this documentation. All medical record entries made by the scribe were at my direction and in my presence. I have reviewed the chart and plan instructions and agree that the record reflects my personal performance and is accurate and complete.   Beverely Pace, DNP. 07/17/2016 3:46 PM

## 2016-07-17 NOTE — Progress Notes (Signed)
Subjective:    Patient ID: Cassandra [name] is a 81 y.o. female.     Cassandra [name] is a 81 yo female who presents with hyperlipidemia. Pt is in the office today for a 6 month medical management follow up concerning hypertension, hypothyroidism, and hyperlipidemia. Pt reports that she was recently seen by Dr. Freida Busman due to left hip pain. Xrays were ordered and Pt reports that resulted normally. She reports that Dr. Freida Busman ordered physical therapy due to IT band issues and was instructed to take OTC Aleve 2 tabs BID. She is scheduled to see Dr. Freida Busman next week to discuss taking hardware out of her left ankle that was placed after a break in the 1950's. She also would like to discuss removal of a skin tag on her chest that itches, gets caught in her clothing and jewelry, and stays irritated.     Patient is compliant with medication and diet with hyperlipidemia. Last labs showed a total cholesterol of 156 and a HDL of 34 on 03/08/16.    The patient is currently taking Hydrochlorothiazide and Cozaar for HTN control. BP in the office today is 127/69. She denies chest pain, chest pressure/discomfort, claudication, dyspnea, exertional chest pressure/discomfort, fatigue, irregular heart beat, lower extremity edema, near-syncope, orthopnea, palpitations, paroxysmal nocturnal dyspnea, syncope and tachypnea.    The patient is currently taking Levothyroxine 112 mcg daily. The patient denies fatigue, cold intolerance, dry skin, scalp hair thinning, constipation, depression, weight gain, slowed mentation, menorrhagia and muscle cramps.      Hyperlipidemia       The following portions of the patient's history were reviewed and updated as appropriate: allergies, current medications, past family history, past medical history, past social history, past surgical history and problem list.    Review of Systems   Constitutional: Negative.    HENT: Negative.    Respiratory: Negative.    Cardiovascular: Negative.    Gastrointestinal: Negative.     Genitourinary: Negative.    Musculoskeletal: Negative.    Skin:        Skin tag on anterior chest    Hematological: Negative.    Psychiatric/Behavioral: Negative.      Objective:   Physical Exam   Constitutional: She is oriented to person, place, and time. Vital signs are normal. She appears well-developed and well-nourished.   Neck: Normal range of motion. Neck supple. Carotid bruit is not present.   Cardiovascular: Normal rate, regular rhythm, S1 normal, S2 normal, normal heart sounds, intact distal pulses and normal pulses.  Exam reveals no gallop and no friction rub.    No murmur heard.  Pulmonary/Chest: Effort normal and breath sounds normal.   Abdominal: Soft. Bowel sounds are normal.   Musculoskeletal: Normal range of motion. She exhibits no edema.   Lymphadenopathy:     She has no cervical adenopathy.   Neurological: She is alert and oriented to person, place, and time.   Skin: Skin is warm and dry. No rash noted.        Skin tag on anterior chest that has evidence of irritation associated with her clothing    Psychiatric: She has a normal mood and affect. Her behavior is normal. Judgment and thought content normal.   PHQ 2-9 Depression Screen Score = 0    Nursing note and vitals reviewed.    Assessment:     1. Essential hypertension    2. Mixed hyperlipidemia    3. Hypothyroidism, unspecified type    4. Anxiety  Plan:     Return in about 6 months (around 01/10/2017).    - Skin tag removal to be scheduled in the near future     Continue current medications  Hold Diclofenac while taking OTC Aleve  Reordered Fenofibrate 160 mg     Reviewed Health Maintenance  Fall risk assessment complete   Depression Screen complete     Reviewed most recent blood work - labs will be drawn to further evaluate status of chronic illnesses at next visit      By signing my name below, I, Dorthula Nettles, attest that this documentation has been prepared under the direction and in the presence of Beverely Pace,  DNP.  Electronically Signed: Dorthula Nettles, Scribe. 07/17/2016 1:11 PM     I, Beverely Pace DNP, personally performed the services described in this documentation. All medical record entries made by the scribe were at my direction and in my presence. I have reviewed the chart and plan instructions and agree that the record reflects my personal performance and is accurate and complete.   Beverely Pace, DNP. 07/17/2016 1:11 PM

## 2016-07-18 ENCOUNTER — Other Ambulatory Visit
Admission: RE | Admit: 2016-07-18 | Discharge: 2016-07-18 | Disposition: A | Discharge status: Home / Self Care | Payer: Medicare Other | Source: Ambulatory Visit | Attending: Family | Admitting: Family

## 2016-07-18 DIAGNOSIS — L989 Disorder of the skin and subcutaneous tissue, unspecified: Secondary | ICD-10-CM

## 2016-09-25 ENCOUNTER — Ambulatory Visit: Payer: Medicare Other | Attending: Family | Admitting: Family

## 2016-09-25 ENCOUNTER — Telehealth (RURAL_HEALTH_CENTER): Payer: Self-pay

## 2016-09-25 VITALS — BP 131/59 | HR 81 | Temp 98.7°F | Resp 18

## 2016-09-25 DIAGNOSIS — L237 Allergic contact dermatitis due to plants, except food: Secondary | ICD-10-CM

## 2016-09-25 MED ORDER — METHYLPREDNISOLONE ACETATE 80 MG/ML IJ SUSP
80.0000 mg | Freq: Once | INTRAMUSCULAR | Status: AC
Start: 2016-09-25 — End: 2016-09-25
  Administered 2016-09-25: 80 mg via INTRAMUSCULAR

## 2016-09-25 NOTE — Patient Instructions (Signed)
Zantac (ranitidine)  Allegra (fexofenadine)  Benadryl is OK as needed

## 2016-09-25 NOTE — Telephone Encounter (Signed)
Is everyone booked? She should be seen. Don't quote me. Just make her an appt.

## 2016-09-25 NOTE — Progress Notes (Signed)
[facility]  Extended Hours Clinic     History of Presenting Illness:     Chief Complaint   Patient presents with   . Poison Ivy     since wed        Cassandra [name] is a 81 y.o. female who presents to the office with concern for Poison ivy rash on bilateral arms, neck, and face for 5 days. Itch is somewhat relieved with topical hydrocortisone. Otherwise well.    Past Medical History:     Past Medical History:   Diagnosis Date   . Disorder of thyroid    . Essential hypertension 10/29/2014   . Hyperlipidemia    . Hypertension    . Onychomycosis of toenail 10/29/2014   . RLS (restless legs syndrome) 10/28/2015   . Symptomatic lesion of skin 07/17/2016       Past Surgical History:     Past Surgical History:   Procedure Laterality Date   . ANKLE SURGERY      Left. Screw placed.   . APPENDECTOMY     . CHOLECYSTECTOMY     . CORRECTION HAMMER TOE      Right foot   . FOOT SURGERY  01/25/15    Pin in Right foot   . HYSTERECTOMY      Vag   . JOINT REPLACEMENT      Left knee   . OTHER SURGICAL HISTORY      Internal pelvic suspension   . SALPINGO OOPHERECTOMY         Family History:     Family History   Problem Relation Age of Onset   . Cancer Mother    . Alzheimer's disease Father    . Obesity Sister    . Diabetes Sister    . Arthritis Sister    . Stroke Brother    . Suicidality Son    . Diabetes Maternal Grandmother    . Heart attack Maternal Grandfather    . Alzheimer's disease Paternal Grandmother    . Stroke Paternal Grandfather      Circulation issues       Social History:     History   Smoking Status   . Never Smoker   Smokeless Tobacco   . Never Used     History   Alcohol Use No     History   Drug Use No       Allergies:   No Known Allergies    Medications:     Prior to Admission medications    Medication Sig Start Date End Date Taking? Authorizing Provider   betamethasone dipropionate (DIPROLENE) 0.05 % ointment Apply topically 2 (two) times daily. 10/12/15   Beverely Pace, FNP   clonazePAM (KLONOPIN) 1 MG  tablet TAKE 1 TABLET EVERY NIGHT AS NEEDED FOR ANXIETY 07/11/16   Beverely Pace, FNP   Coenzyme Q10 (CO Q 10 PO) Take 200 mg by mouth daily.        [provider]   CRANBERRY CONCENTRATE PO Take 4,200 mg by mouth daily.    [provider]   diclofenac (VOLTAREN) 75 MG EC tablet TAKE 1 TABLET TWICE DAILY AS NEEDED FOR PAIN 07/13/16   Graciela Husbands, DO   diphenhydrAMINE-acetaminophen (TYLENOL PM) 25-500 MG Tab Take 1 tablet by mouth nightly as needed.    [provider]   fenofibrate (LOFIBRA) 160 MG tablet Take 1 tablet (160 mg total) by mouth daily. 07/11/16   Beverely Pace, FNP  ibandronate (BONIVA) 150 MG tablet Take 1 tablet (150 mg total) by mouth every 30 (thirty) days. 10/14/15 10/13/16  Beverely Pace, FNP   levothyroxine (SYNTHROID, LEVOTHROID) 112 MCG tablet TAKE 1 TABLET EVERY DAY 03/29/16   Beverely Pace, FNP   losartan-hydrochlorothiazide [facility]) 50-12.5 MG per tablet TAKE 1 TABLET EVERY DAY 03/29/16   Beverely Pace, FNP   lovastatin (MEVACOR) 20 MG tablet TAKE ONE TABLET BY MOUTH ONCE DAILY FOR CHOLESTEROL 03/29/16   Beverely Pace, FNP   Multiple Vitamin (MULTIVITAMIN) tablet Take 1 tablet by mouth daily.    [provider]   Multiple Vitamins-Minerals (HAIR/SKIN/NAILS/BIOTIN PO) Take 1 tablet by mouth daily.    [provider]   naproxen sodium (ANAPROX) 220 MG tablet Take 440 mg by mouth 2 (two) times daily with meals.    [provider]   pantoprazole (PROTONIX) 40 MG tablet TAKE 1 TABLET EVERY DAY 03/29/16   Beverely Pace, FNP   Polyethylene Glycol 3350 (MIRALAX PO) Take by mouth daily.    [provider]   potassium chloride (K-DUR,KLOR-CON) 20 MEQ tablet TAKE 1 TABLET EVERY DAY 03/29/16   Beverely Pace, FNP   Turmeric 500 MG Tab Take 1 tablet by mouth daily.    [provider]   venlafaxine (EFFEXOR-XR) 75 MG 24 hr capsule TAKE 1 CAPSULE EVERY DAY 03/29/16   Beverely Pace, FNP       Review of Systems:   Review of Systems    Skin: Positive for itching and rash.   All other systems reviewed and are negative.        Physical Exam:     Vitals:    09/25/16 1703   BP: 131/59   Pulse: 81   Resp: 18   Temp: 98.7 F (37.1 C)   SpO2: 98%     There is no height or weight on file to calculate BMI.    Physical Exam   Constitutional: She is oriented to person, place, and time. She appears well-developed and well-nourished. No distress.   HENT:   Head: Normocephalic and atraumatic.   Right Ear: External ear normal.   Left Ear: External ear normal.   Eyes: Conjunctivae and EOM are normal. Right eye exhibits no discharge. Left eye exhibits no discharge.   Neck: Normal range of motion. Neck supple.   Cardiovascular: Normal rate, regular rhythm and normal heart sounds.    Pulmonary/Chest: Effort normal and breath sounds normal.   Lymphadenopathy:     She has no cervical adenopathy.   Neurological: She is alert and oriented to person, place, and time. She exhibits normal muscle tone. Coordination normal.   Skin: Skin is warm and dry. She is not diaphoretic.   Erythematous, vesicular rashes on b/l arms, hands, and on R side of neck and both cheeks.   Psychiatric: She has a normal mood and affect. Her behavior is normal. Judgment and thought content normal.   Nursing note and vitals reviewed.      Assessment:     1. Poison ivy dermatitis         Plan:   Depo-Medrol 80 mg IM in office  Allegra and Zantac daily  Benadryl as needed  No steroid creams on the face  RTO PRN if no improvement or symptoms worsen.      Signed by: Abram Sander, FNP-BC    This note was completed using Dragon Medical speech recognition software. Grammatical errors, random word insertions, pronoun errors and incomplete sentences are occasional consequences of  this technology due to software limitations. If there are questions or concerns about the content of this note or information contained within the body of this dictation they should be addressed with the provider for  clarification.

## 2016-09-25 NOTE — Telephone Encounter (Signed)
Colleen, could you call pt and see if she will come in for an appt..... Please see Dr. Alene Mires response below before calling......Marland Kitchenpkt

## 2016-09-25 NOTE — Telephone Encounter (Signed)
Patient has been notified and will come in to extended hours this evening//close encounter//cme

## 2016-09-25 NOTE — Telephone Encounter (Signed)
Patient has gotten poison and wants some steroids sent in to the pharmacy.  Please call and let know.

## 2016-09-28 ENCOUNTER — Telehealth (RURAL_HEALTH_CENTER): Payer: Self-pay | Admitting: Family

## 2016-09-28 ENCOUNTER — Ambulatory Visit: Payer: Medicare Other | Attending: Medical | Admitting: Medical

## 2016-09-28 ENCOUNTER — Encounter (RURAL_HEALTH_CENTER): Payer: Self-pay | Admitting: Medical

## 2016-09-28 VITALS — BP 131/79 | HR 71 | Temp 97.5°F | Resp 16

## 2016-09-28 DIAGNOSIS — L237 Allergic contact dermatitis due to plants, except food: Secondary | ICD-10-CM

## 2016-09-28 MED ORDER — PREDNISONE 10 MG PO TABS
ORAL_TABLET | ORAL | 0 refills | Status: DC
Start: 2016-09-28 — End: 2017-01-11

## 2016-09-28 NOTE — Progress Notes (Signed)
Subjective:    Patient ID: Cassandra [name] is a 81 y.o. female.    HPI    [name] is a 81 year old female who presents to clinic with complaints of poison ivy. She was initially seen in clinic on 09/25/16 for this same issue and was given Depo-Medrol 80 mg IM. Initially, it seemed like the rash was getting better but then the rash started to come back and she developed new breakouts including her neck and right ear. She continues to have a rash on her face, upper extremities, and left breast. She complains that the blisters are "weeping" and the rash is very itchy. She has been using calamine lotion and hydroco    The following portions of the patient's history were reviewed and updated as appropriate: allergies, current medications, past family history, past medical history, past social history, past surgical history and problem list.    Review of Systems   Constitutional: Negative for activity change, appetite change, chills and fever.   Skin: Positive for rash (face, arms, left breast).   All other systems reviewed and are negative.          Objective:    Physical Exam   Constitutional: She is oriented to person, place, and time. She appears well-developed and well-nourished.   HENT:   Head: Normocephalic and atraumatic.   Eyes: EOM are normal.   Neck: Normal range of motion. Neck supple.   Cardiovascular: Normal rate, regular rhythm, S1 normal, S2 normal and normal heart sounds.  Exam reveals no gallop and no friction rub.    No murmur heard.  Pulmonary/Chest: Effort normal and breath sounds normal. No accessory muscle usage. No respiratory distress. She has no wheezes. She has no rales. She exhibits no tenderness.   Abdominal: Soft. Bowel sounds are normal. She exhibits no distension. There is no tenderness.   Neurological: She is alert and oriented to person, place, and time.   Skin: Skin is warm and dry.   Vesicular linear lesion on left cheek.  Singular lesion on right cheek.  Vesicles on bilateral arms and  left breast.  No purulence. No streaking. No erythema.   Psychiatric: She has a normal mood and affect.   Nursing note and vitals reviewed.    Visit Vitals  BP 131/79   Pulse 71   Temp 97.5 F (36.4 C) (Oral)   Resp 16   SpO2 96%           Assessment:       1. Poison ivy dermatitis          Plan:       -Will do a taper of prednisone:   40 mg x 3 days    30 mg x 3 days   20 mg x 3 days    10 mg x 3 days    -Continue calamine lotion, reminded patient to avoid putting hydrocortisone on the face  -Continue anti-histamine    Irven Easterly, PA-C

## 2016-09-28 NOTE — Telephone Encounter (Signed)
appt tomorrow at 1030 AM.p

## 2016-09-28 NOTE — Patient Instructions (Signed)
Will do a taper of prednisone:  40 mg (4 tablets) x 3 days   30 mg (3 tablets) x 3 days   20 mg (2 tablets) x 3 days   10 mg (1 tablet) x 3 days       Poison Ivy Rash  You have a rash and itching. This is a delayed reaction to the oils of the poison ivy plant. You likely came in contact with it during the 3 days before your symptoms began. Your skin will become red and itchy. Small blisters may appear. These can break and leak a clear yellow fluid. This fluid is not contagious. The reaction usually starts to go away after 1 to 2 weeks. But it may take 4 to 6 weeks to fully clear.    Home care  Follow these guidelines when caring for yourself at home:   The plant oils still on your skin or clothes can be spread to other places on your body. They can also be passed on to other people and cause a similar reaction. That's why it's important to wash all of the plant oils off your skin and any clothes that may have been exposed.Wash all clothes that you were wearing. Use hot water with ordinary laundry detergent.   Don't use over-the-counter creams that have neomycin or bacitracin. These may make the rash worse.   Avoid anything that heats up your skin. This includes hot showers or baths, or direct sunlight. These can make itching worse.   Put a cold compress on areas that are leaking (weeping), or on blistered areas. Do this for 30 minutes 3 times a day. To make a cold compress, dip a wash cloth in a mixture of 1 pint of cold water and 1 packet of astringent or oatmeal bath powder. Keep the solution in the refrigerator for future use.   If large areas of skin are affected, take a lukewarm bath. Add colloidal oatmeal, or 1 cup of cornstarch or baking soda to the water.   For a rash in a smaller area, use hydrocortisone cream for redness and irritation. But don't use this if another medicine was prescribed. For severe itching, put an ice pack on the area. To make an ice pack, put ice cubes in a plasticbag that  seals at the top. Wrap the bag in aclean, thintowel or cloth. Never put ice or an ice pack directly on the skin. Over-the-counter products that have calamine lotion may also be helpful.   You can also use an oral antihistamine medicine with diphenhydramine for itching, unless another medicine was prescribed. This medicine may make you sleepy. So use lower doses during the daytime and higher doses at bedtime. Don't use medicine that has diphenhydramine if you have glaucoma. Also don't use it if you are a man who has trouble urinating because of an enlarged prostate. Antihistamines with loratidine cause less drowsiness. They are a good choice for daytime use.   For severe cases, your provider may prescribe oral steroid medicines. Always take these exactly as prescribed.  Follow-up care  Follow up with your healthcare provider, or as directed. Call your provider if your rash gets worse oryou are not starting to get better after 1 week of treatment.  When to seek medical advice  Call your healthcare provider right awayif any of these occur:   Spreading facial rash with swollen mouth or eyelids   Rash that spreads to the groin and causes swelling of the penis, scrotum, or vaginal  area   Trouble urinating because of swelling in the genital area  Also call your provider if you have signs of infection in the areas of broken blisters:   Spreading redness   Pus or fluid draining from the blisters   Yellow-brown crusts form over the open blisters   Fever of 1 degree, or higher, above your normal temperature, or as directed by your provider  Call 911  Call 911 if you have severe swelling on your face, eyelids, mouth, throat, or tongue.  Date Last Reviewed: 10/26/2014   2000-2016 [employer] [address] [place], [place] [address]. All rights reserved. This information is not intended as a substitute for professional medical care. Always follow your healthcare professional's instructions.

## 2016-09-28 NOTE — Telephone Encounter (Signed)
Cassandra [name] called saying she was in [facility] Monday for poison oak, poison ivy, she did get a shot but today it spreading and itching to the max, is there anything else she can do or can you see her? [phone]. Can leave message.

## 2016-09-29 ENCOUNTER — Ambulatory Visit (RURAL_HEALTH_CENTER): Payer: Medicare Other | Admitting: Family

## 2016-12-05 ENCOUNTER — Other Ambulatory Visit (RURAL_HEALTH_CENTER): Payer: Self-pay

## 2016-12-08 ENCOUNTER — Other Ambulatory Visit (RURAL_HEALTH_CENTER): Payer: Self-pay | Admitting: Family

## 2016-12-27 ENCOUNTER — Other Ambulatory Visit (RURAL_HEALTH_CENTER): Payer: Self-pay | Admitting: Internal Medicine

## 2016-12-27 ENCOUNTER — Other Ambulatory Visit (RURAL_HEALTH_CENTER): Payer: Self-pay | Admitting: Family

## 2017-01-11 ENCOUNTER — Encounter (RURAL_HEALTH_CENTER): Payer: Self-pay | Admitting: Family

## 2017-01-11 ENCOUNTER — Ambulatory Visit: Payer: Medicare Other | Attending: Family | Admitting: Family

## 2017-01-11 ENCOUNTER — Other Ambulatory Visit
Admission: RE | Admit: 2017-01-11 | Discharge: 2017-01-11 | Disposition: A | Discharge status: Home / Self Care | Payer: Medicare Other | Source: Ambulatory Visit | Attending: Family | Admitting: Family

## 2017-01-11 DIAGNOSIS — G2581 Restless legs syndrome: Secondary | ICD-10-CM

## 2017-01-11 DIAGNOSIS — I10 Essential (primary) hypertension: Secondary | ICD-10-CM

## 2017-01-11 DIAGNOSIS — Z23 Encounter for immunization: Secondary | ICD-10-CM

## 2017-01-11 DIAGNOSIS — E782 Mixed hyperlipidemia: Secondary | ICD-10-CM

## 2017-01-11 DIAGNOSIS — E039 Hypothyroidism, unspecified: Secondary | ICD-10-CM

## 2017-01-11 LAB — CBC AND DIFFERENTIAL
Basophils %: 0.6 % (ref 0.0–3.0)
Basophils Absolute: 0 10*3/uL (ref 0.0–0.3)
Eosinophils %: 2 % (ref 0.0–7.0)
Eosinophils Absolute: 0.2 10*3/uL (ref 0.0–0.8)
Hematocrit: 37.8 % (ref 36.0–48.0)
Hemoglobin: 12.4 gm/dL (ref 12.0–16.0)
Lymphocytes Absolute: 1.7 10*3/uL (ref 0.6–5.1)
Lymphocytes: 22.5 % (ref 15.0–46.0)
MCH: 30 pg (ref 28–35)
MCHC: 33 gm/dL (ref 32–36)
MCV: 91 fL (ref 80–100)
MPV: 8 fL (ref 6.0–10.0)
Monocytes Absolute: 0.6 10*3/uL (ref 0.1–1.7)
Monocytes: 8.2 % (ref 3.0–15.0)
Neutrophils %: 66.7 % (ref 42.0–78.0)
Neutrophils Absolute: 5.2 10*3/uL (ref 1.7–8.6)
PLT CT: 343 10*3/uL (ref 130–440)
RBC: 4.15 10*6/uL (ref 3.80–5.00)
RDW: 12.4 % (ref 11.0–14.0)
WBC: 7.8 10*3/uL (ref 4.0–11.0)

## 2017-01-11 LAB — LIPID PANEL
Cholesterol: 156 mg/dL (ref 75–199)
Coronary Heart Disease Risk: 5.78
HDL: 27 mg/dL — ABNORMAL LOW (ref 45–65)
LDL Calculated: 96 mg/dL
Triglycerides: 166 mg/dL — ABNORMAL HIGH (ref 10–150)
VLDL: 33 (ref 0–40)

## 2017-01-11 LAB — COMPREHENSIVE METABOLIC PANEL
ALT: 18 U/L (ref 0–55)
AST (SGOT): 26 U/L (ref 10–42)
Albumin/Globulin Ratio: 1.18 Ratio (ref 0.70–1.50)
Albumin: 3.9 gm/dL (ref 3.5–5.0)
Alkaline Phosphatase: 47 U/L (ref 40–145)
Anion Gap: 15.3 mMol/L (ref 7.0–18.0)
BUN / Creatinine Ratio: 22 Ratio (ref 10.0–30.0)
BUN: 29 mg/dL — ABNORMAL HIGH (ref 7–22)
Bilirubin, Total: 0.4 mg/dL (ref 0.1–1.2)
CO2: 23.8 mMol/L (ref 20.0–30.0)
Calcium: 10.3 mg/dL (ref 8.5–10.5)
Chloride: 106 mMol/L (ref 98–110)
Creatinine: 1.32 mg/dL — ABNORMAL HIGH (ref 0.60–1.20)
EGFR: 38 mL/min/{1.73_m2} — ABNORMAL LOW (ref 60–150)
Globulin: 3.3 gm/dL (ref 2.0–4.0)
Glucose: 89 mg/dL (ref 71–99)
Osmolality Calc: 285 mOsm/kg (ref 275–300)
Potassium: 5.1 mMol/L (ref 3.5–5.3)
Protein, Total: 7.2 gm/dL (ref 6.0–8.3)
Sodium: 140 mMol/L (ref 136–147)

## 2017-01-11 LAB — T3, FREE: T3, Free: <1 pg/mL — ABNORMAL LOW (ref 1.71–3.71)

## 2017-01-11 LAB — THYROID STIMULATING HORMONE (TSH), REFLEX ON ABNORMAL TO FREE T4, SERUM: TSH: 2.63 u[IU]/mL (ref 0.40–4.20)

## 2017-01-11 NOTE — Progress Notes (Signed)
Subjective:    Patient ID: Cassandra [name] is a 81 y.o. female.        Cassandra [name] is a 81 yo female who presents with hypertension. She is in the office today for a medical management follow up. She is feeling well with no new complaints or issues. The patient is currently taking Hydrochlorothiazide and Cozaar for HTN control. BP in the office today is 125/72. She denies chest pain, chest pressure/discomfort, claudication, dyspnea, exertional chest pressure/discomfort, fatigue, irregular heart beat, lower extremity edema, near-syncope, orthopnea, palpitations, paroxysmal nocturnal dyspnea, syncope and tachypnea.   Patient is compliant with medication and diet with hyperlipidemia. Last labs showed a total cholesterol of 156 and a HDL of 34 on 03/08/16.    The patient is currently taking Levothyroxine 112 mcg daily. The patient denies fatigue, cold intolerance, dry skin, scalp hair thinning, constipation, depression, weight gain, slowed mentation, menorrhagia and muscle cramps.        The following portions of the patient's history were reviewed and updated as appropriate: allergies, current medications, past family history, past medical history, past social history, past surgical history and problem list.    Review of Systems   Constitutional: Negative for activity change, appetite change, chills, diaphoresis, fatigue, fever and unexpected weight change.   HENT: Negative for congestion, ear pain, postnasal drip, rhinorrhea, sinus pressure, sneezing, sore throat and trouble swallowing.    Respiratory: Negative for apnea, cough, chest tightness, shortness of breath and wheezing.    Cardiovascular: Negative for chest pain, palpitations and leg swelling.   Gastrointestinal: Negative for abdominal distention, abdominal pain, blood in stool, constipation, diarrhea, nausea and vomiting.   Genitourinary: Negative for decreased urine volume, difficulty urinating, dyspareunia, dysuria, enuresis, flank pain, frequency,  hematuria, menstrual problem and urgency.   Musculoskeletal: Negative for arthralgias, back pain, joint swelling, myalgias, neck pain and neck stiffness.   Skin: Negative for rash.   Neurological: Negative for dizziness, tremors, syncope, weakness, light-headedness, numbness and headaches.   Hematological: Negative for adenopathy.   Psychiatric/Behavioral: Negative for confusion, decreased concentration, dysphoric mood and sleep disturbance. The patient is not nervous/anxious.      Objective:   Physical Exam   Constitutional: She is oriented to person, place, and time. Vital signs are normal. She appears well-developed and well-nourished.   Neck: Normal range of motion. Neck supple. Carotid bruit is not present. No thyromegaly present.   Cardiovascular: Normal rate, regular rhythm, S1 normal, S2 normal, normal heart sounds and intact distal pulses.  Exam reveals no gallop and no friction rub.    No murmur heard.  Pulses:       Dorsalis pedis pulses are 2+ on the right side, and 2+ on the left side.        Posterior tibial pulses are 2+ on the right side, and 2+ on the left side.   Pulmonary/Chest: Effort normal and breath sounds normal.   Abdominal: Soft. Bowel sounds are normal.   Musculoskeletal: Normal range of motion. She exhibits no edema.   Lymphadenopathy:     She has no cervical adenopathy.   Neurological: She is alert and oriented to person, place, and time.   Skin: Skin is warm and dry. No rash noted.   Psychiatric: She has a normal mood and affect. Her behavior is normal.   Nursing note and vitals reviewed.    Assessment:       1. Essential hypertension    2. Hypothyroidism, unspecified type    3. RLS (restless legs  syndrome)    4. Mixed hyperlipidemia    5. Need for shingles vaccine      Plan:       Return in about 6 months (around 07/12/2017).    Continue current medication    Reviewed Health Maintenance  Ordered Shingrix vaccine - shingles  Ordered and administered Influenza vaccine     Based on  multiple chronic conditions that place the patient at significant 12 month risk for morbidity and mortality, this patient would benefit from chronic care management services and care coordination, which may include but will not be limited not medications reconciliation, prescription refills, appointment scheduling, assistance with transportation to or from doctor appointments, nutritional guidance, escalation of care for specific symptoms, and disease specific guidance including preventive measures. The patient is eligible for the Medicare covered benefit for Chronic Care Management. I discussed this with the patient, and the patient declines enrollment in the CCM program at this time.       Reviewed most recent labs   Ordered CBC, CMP, TSH reflex T4, T3 free, and Lipid Panel     By signing my name below, I, Dorthula Nettles, attest that this documentation has been prepared under the direction and in the presence of Beverely Pace, DNP.  Electronically Signed: Dorthula Nettles, Scribe. 01/11/2017 2:19 PM     I, Beverely Pace DNP, personally performed the services described in this documentation. All medical record entries made by the scribe were at my direction and in my presence. I have reviewed the chart and plan instructions and agree that the record reflects my personal performance and is accurate and complete.   Beverely Pace, DNP. 01/11/2017 2:19 PM

## 2017-01-11 NOTE — Progress Notes (Signed)
Subjective:    Patient ID: Cassandra [name] is a 81 y.o. female.        Cassandra [name] is a 81 yo female who presents with hypertension. She is in the office today for a medical management follow up. She is feeling well with no new complaints or issues. The patient is currently taking Hydrochlorothiazide and Cozaar for HTN control. BP in the office today is 125/72. She denies chest pain, chest pressure/discomfort, claudication, dyspnea, exertional chest pressure/discomfort, fatigue, irregular heart beat, lower extremity edema, near-syncope, orthopnea, palpitations, paroxysmal nocturnal dyspnea, syncope and tachypnea.   Patient is compliant with medication and diet with hyperlipidemia. Last labs showed a total cholesterol of 156 and a HDL of 34 on 03/08/16.    The patient is currently taking Levothyroxine 112 mcg daily. The patient denies fatigue, cold intolerance, dry skin, scalp hair thinning, constipation, depression, weight gain, slowed mentation, menorrhagia and muscle cramps.      Hypertension   Pertinent negatives include no chest pain, headaches, neck pain, palpitations or shortness of breath.       The following portions of the patient's history were reviewed and updated as appropriate: allergies, current medications, past family history, past medical history, past social history, past surgical history and problem list.    Review of Systems   Constitutional: Negative for activity change, appetite change, chills, diaphoresis, fatigue, fever and unexpected weight change.   HENT: Negative for congestion, ear pain, postnasal drip, rhinorrhea, sinus pressure, sneezing, sore throat and trouble swallowing.    Respiratory: Negative for apnea, cough, chest tightness, shortness of breath and wheezing.    Cardiovascular: Negative for chest pain, palpitations and leg swelling.   Gastrointestinal: Negative for abdominal distention, abdominal pain, blood in stool, constipation, diarrhea, nausea and vomiting.   Genitourinary:  Negative for decreased urine volume, difficulty urinating, dyspareunia, dysuria, enuresis, flank pain, frequency, hematuria, menstrual problem and urgency.   Musculoskeletal: Negative for arthralgias, back pain, joint swelling, myalgias, neck pain and neck stiffness.   Skin: Negative for rash.   Neurological: Negative for dizziness, tremors, syncope, weakness, light-headedness, numbness and headaches.   Hematological: Negative for adenopathy.   Psychiatric/Behavioral: Negative for confusion, decreased concentration, dysphoric mood and sleep disturbance. The patient is not nervous/anxious.      Objective:   Physical Exam   Constitutional: She is oriented to person, place, and time. Vital signs are normal. She appears well-developed and well-nourished.   Neck: Normal range of motion. Neck supple. Carotid bruit is not present. No thyromegaly present.   Cardiovascular: Normal rate, regular rhythm, S1 normal, S2 normal, normal heart sounds and intact distal pulses.  Exam reveals no gallop and no friction rub.    No murmur heard.  Pulses:       Dorsalis pedis pulses are 2+ on the right side, and 2+ on the left side.        Posterior tibial pulses are 2+ on the right side, and 2+ on the left side.   Pulmonary/Chest: Effort normal and breath sounds normal.   Abdominal: Soft. Bowel sounds are normal.   Musculoskeletal: Normal range of motion. She exhibits no edema.   Lymphadenopathy:     She has no cervical adenopathy.   Neurological: She is alert and oriented to person, place, and time.   Skin: Skin is warm and dry. No rash noted.   Psychiatric: She has a normal mood and affect. Her behavior is normal.   Nursing note and vitals reviewed.    Assessment:  1. Essential hypertension    2. Hypothyroidism, unspecified type    3. RLS (restless legs syndrome)    4. Mixed hyperlipidemia    5. Need for shingles vaccine    6. Need for prophylactic vaccination and inoculation against influenza      Plan:       Return in about 6  months (around 07/12/2017).    Continue current medication    Reviewed Health Maintenance  Ordered Shingrix vaccine - shingles  Ordered and administered Influenza vaccine     Based on multiple chronic conditions that place the patient at significant 12 month risk for morbidity and mortality, this patient would benefit from chronic care management services and care coordination, which may include but will not be limited not medications reconciliation, prescription refills, appointment scheduling, assistance with transportation to or from doctor appointments, nutritional guidance, escalation of care for specific symptoms, and disease specific guidance including preventive measures. The patient is eligible for the Medicare covered benefit for Chronic Care Management. I discussed this with the patient, and the patient declines enrollment in the CCM program at this time.       Reviewed most recent labs   Ordered CBC, CMP, TSH reflex T4, T3 free, and Lipid Panel     By signing my name below, I, Dorthula Nettles, attest that this documentation has been prepared under the direction and in the presence of Beverely Pace, DNP.  Electronically Signed: Dorthula Nettles, Scribe. 01/11/2017 5:07 PM     I, Beverely Pace DNP, personally performed the services described in this documentation. All medical record entries made by the scribe were at my direction and in my presence. I have reviewed the chart and plan instructions and agree that the record reflects my personal performance and is accurate and complete.   Beverely Pace, DNP. 01/11/2017 5:07 PM

## 2017-02-28 ENCOUNTER — Other Ambulatory Visit (RURAL_HEALTH_CENTER): Payer: Self-pay | Admitting: Family

## 2017-05-15 ENCOUNTER — Other Ambulatory Visit (RURAL_HEALTH_CENTER): Payer: Self-pay | Admitting: Internal Medicine

## 2017-05-18 ENCOUNTER — Other Ambulatory Visit (RURAL_HEALTH_CENTER): Payer: Self-pay | Admitting: Family

## 2017-06-26 ENCOUNTER — Encounter (RURAL_HEALTH_CENTER): Payer: Self-pay | Admitting: Family

## 2017-06-26 ENCOUNTER — Other Ambulatory Visit
Admission: RE | Admit: 2017-06-26 | Discharge: 2017-06-26 | Disposition: A | Discharge status: Home / Self Care | Payer: Medicare Other | Source: Ambulatory Visit | Attending: Family | Admitting: Family

## 2017-06-26 ENCOUNTER — Ambulatory Visit: Payer: Medicare Other | Attending: Family | Admitting: Family

## 2017-06-26 DIAGNOSIS — E039 Hypothyroidism, unspecified: Secondary | ICD-10-CM

## 2017-06-26 DIAGNOSIS — I10 Essential (primary) hypertension: Secondary | ICD-10-CM

## 2017-06-26 DIAGNOSIS — E782 Mixed hyperlipidemia: Secondary | ICD-10-CM

## 2017-06-26 LAB — CBC AND DIFFERENTIAL
Basophils %: 0.6 % (ref 0.0–3.0)
Basophils Absolute: 0 10*3/uL (ref 0.0–0.3)
Eosinophils %: 3.7 % (ref 0.0–7.0)
Eosinophils Absolute: 0.2 10*3/uL (ref 0.0–0.8)
Hematocrit: 37.2 % (ref 36.0–48.0)
Hemoglobin: 12.2 gm/dL (ref 12.0–16.0)
Lymphocytes Absolute: 1.2 10*3/uL (ref 0.6–5.1)
Lymphocytes: 22.9 % (ref 15.0–46.0)
MCH: 30 pg (ref 28–35)
MCHC: 33 gm/dL (ref 32–36)
MCV: 93 fL (ref 80–100)
MPV: 8.7 fL (ref 6.0–10.0)
Monocytes Absolute: 0.4 10*3/uL (ref 0.1–1.7)
Monocytes: 8.5 % (ref 3.0–15.0)
Neutrophils %: 64.2 % (ref 42.0–78.0)
Neutrophils Absolute: 3.3 10*3/uL (ref 1.7–8.6)
PLT CT: 320 10*3/uL (ref 130–440)
RBC: 4.02 10*6/uL (ref 3.80–5.00)
RDW: 13.1 % (ref 11.0–14.0)
WBC: 5.1 10*3/uL (ref 4.0–11.0)

## 2017-06-26 LAB — COMPREHENSIVE METABOLIC PANEL
ALT: 17 U/L (ref 0–55)
AST (SGOT): 25 U/L (ref 10–42)
Albumin/Globulin Ratio: 1.27 Ratio (ref 0.70–1.50)
Albumin: 3.8 gm/dL (ref 3.5–5.0)
Alkaline Phosphatase: 36 U/L — ABNORMAL LOW (ref 40–145)
Anion Gap: 14.9 mMol/L (ref 7.0–18.0)
BUN / Creatinine Ratio: 25.2 Ratio (ref 10.0–30.0)
BUN: 35 mg/dL — ABNORMAL HIGH (ref 7–22)
Bilirubin, Total: 0.5 mg/dL (ref 0.1–1.2)
CO2: 25.7 mMol/L (ref 20.0–30.0)
Calcium: 9.7 mg/dL (ref 8.5–10.5)
Chloride: 106 mMol/L (ref 98–110)
Creatinine: 1.39 mg/dL — ABNORMAL HIGH (ref 0.60–1.20)
EGFR: 36 mL/min/{1.73_m2} — ABNORMAL LOW (ref 60–150)
Globulin: 3 gm/dL (ref 2.0–4.0)
Glucose: 98 mg/dL (ref 71–99)
Osmolality Calc: 291 mOsm/kg (ref 275–300)
Potassium: 4.6 mMol/L (ref 3.5–5.3)
Protein, Total: 6.8 gm/dL (ref 6.0–8.3)
Sodium: 142 mMol/L (ref 136–147)

## 2017-06-26 LAB — THYROID STIMULATING HORMONE (TSH), REFLEX ON ABNORMAL TO FREE T4, SERUM
T4 Free: 1.16 ng/dL (ref 0.70–1.48)
TSH: 0.38 u[IU]/mL — ABNORMAL LOW (ref 0.40–4.20)

## 2017-06-26 LAB — LIPID PANEL
Cholesterol: 170 mg/dL (ref 75–199)
Coronary Heart Disease Risk: 5.67
HDL: 30 mg/dL — ABNORMAL LOW (ref 45–65)
LDL Calculated: 105 mg/dL
Triglycerides: 177 mg/dL — ABNORMAL HIGH (ref 10–150)
VLDL: 35 (ref 0–40)

## 2017-06-26 LAB — T3, FREE: T3, Free: 1.2 pg/mL — ABNORMAL LOW (ref 1.71–3.71)

## 2017-06-26 NOTE — Progress Notes (Signed)
Subjective:    Patient ID: Cassandra [name] is a 82 y.o. female.     Cassandra [name] is a 82 yo female who presents with hypertension, hypothyroidism and hyperlipidemia. She is in the office today for a 6 month medical management follow up. She has lost 8 lbs since her last visit and has been trying to lose weight by monitoring her caloric intake.  Pt is feeling well with no new complaints or issues. Last labs showed a total cholesterol of 156, total triglycerides of 166 and a HDL of 27 on 01/11/17. She is currently taking Levothyroxine 112 mcg daily. The patient denies fatigue, cold intolerance, dry skin, scalp hair thinning, constipation, depression, weight gain, slowed mentation, menorrhagia and muscle cramps. She is currently taking Hydrochlorothiazide and Cozaar for HTN control. BP in the office today is 127/67.      Hypertension   This is a chronic problem. The current episode started more than 1 year ago. The problem is unchanged. The problem is controlled. Pertinent negatives include no chest pain, headaches, neck pain, palpitations or shortness of breath. Agents associated with hypertension include NSAIDs. Risk factors for coronary artery disease include obesity, dyslipidemia and post-menopausal state. The current treatment provides moderate improvement. There are no compliance problems.  Identifiable causes of hypertension include a thyroid problem.   Thyroid Problem   Patient reports no anxiety, constipation, diaphoresis, diarrhea, fatigue, menstrual problem, palpitations or tremors. Her past medical history is significant for hyperlipidemia.   Hyperlipidemia   Pertinent negatives include no chest pain, myalgias or shortness of breath.       The following portions of the patient's history were reviewed and updated as appropriate: allergies, current medications, past family history, past medical history, past social history, past surgical history and problem list.    Review of Systems   Constitutional:  Negative for activity change, appetite change, chills, diaphoresis, fatigue, fever and unexpected weight change.   HENT: Negative for congestion, ear pain, postnasal drip, rhinorrhea, sinus pressure, sneezing, sore throat and trouble swallowing.    Respiratory: Negative for apnea, cough, chest tightness, shortness of breath and wheezing.    Cardiovascular: Negative for chest pain, palpitations and leg swelling.   Gastrointestinal: Negative for abdominal distention, abdominal pain, blood in stool, constipation, diarrhea, nausea and vomiting.   Genitourinary: Negative for decreased urine volume, difficulty urinating, dyspareunia, dysuria, enuresis, flank pain, frequency, hematuria, menstrual problem and urgency.   Musculoskeletal: Negative for arthralgias, back pain, joint swelling, myalgias, neck pain and neck stiffness.   Skin: Negative for rash.   Neurological: Negative for dizziness, tremors, syncope, weakness, light-headedness, numbness and headaches.   Hematological: Negative for adenopathy.   Psychiatric/Behavioral: Negative for confusion, decreased concentration, dysphoric mood and sleep disturbance. The patient is not nervous/anxious.            Objective:    Physical Exam   Constitutional: She is oriented to person, place, and time. Vital signs are normal. She appears well-developed and well-nourished.   Neck: Normal range of motion. Neck supple. Carotid bruit is not present. No thyromegaly present.   Cardiovascular: Normal rate, regular rhythm, S1 normal, S2 normal, normal heart sounds and intact distal pulses.  Exam reveals no gallop and no friction rub.    No murmur heard.  Pulses:       Dorsalis pedis pulses are 2+ on the right side, and 2+ on the left side.        Posterior tibial pulses are 2+ on the right side, and 2+  on the left side.   Pulmonary/Chest: Effort normal and breath sounds normal.   Abdominal: Soft. Bowel sounds are normal.   Musculoskeletal: Normal range of motion. She exhibits no edema.    Lymphadenopathy:     She has no cervical adenopathy.   Neurological: She is alert and oriented to person, place, and time.   Skin: Skin is warm and dry. No rash noted.   Psychiatric: She has a normal mood and affect. Her behavior is normal.   Nursing note and vitals reviewed.    Assessment:     1. Essential hypertension    2. Mixed hyperlipidemia    3. Hypothyroidism, unspecified type      Plan:     Continue current medications  Ordered CBC, CMP, Lipid Panel, TSH reflex and T3 free    Continue to monitor caloric intake to help reduce weight    Return in about 6 months (around 12/26/2017).    By signing my name below, I, Dorthula Nettles, attest that this documentation has been prepared under the direction and in the presence of Beverely Pace, DNP.  Electronically Signed: Dorthula Nettles, Scribe. 06/26/2017 12:06 PM     I, Beverely Pace DNP, personally performed the services described in this documentation. All medical record entries made by the scribe were at my direction and in my presence. I have reviewed the chart and plan instructions and agree that the record reflects my personal performance and is accurate and complete.   Beverely Pace, DNP. 06/26/2017 12:06 PM

## 2017-06-27 ENCOUNTER — Other Ambulatory Visit (RURAL_HEALTH_CENTER): Payer: Self-pay | Admitting: Family

## 2017-06-28 ENCOUNTER — Other Ambulatory Visit (RURAL_HEALTH_CENTER): Payer: Self-pay | Admitting: Family

## 2017-06-28 DIAGNOSIS — E039 Hypothyroidism, unspecified: Secondary | ICD-10-CM

## 2017-06-28 NOTE — Progress Notes (Signed)
Subjective:    Patient ID: Cassandra [name] is a 82 y.o. female.     Ms Gebbia is a 82 yo female who presents with hypertension, hypothyroidism and hyperlipidemia. She is in the office today for a 6 month medical management follow up. She has lost 8 lbs since her last visit and has been trying to lose weight by monitoring her caloric intake.  Pt is feeling well with no new complaints or issues. Last labs showed a total cholesterol of 156, total triglycerides of 166 and a HDL of 27 on 01/11/17. She is currently taking Levothyroxine 112 mcg daily. The patient denies fatigue, cold intolerance, dry skin, scalp hair thinning, constipation, depression, weight gain, slowed mentation, menorrhagia and muscle cramps. She is currently taking Hydrochlorothiazide and Cozaar for HTN control. BP in the office today is 127/67.      Hypertension   This is a chronic problem. The current episode started more than 1 year ago. The problem is unchanged. The problem is controlled. Pertinent negatives include no chest pain, headaches, neck pain, palpitations or shortness of breath. Agents associated with hypertension include NSAIDs. Risk factors for coronary artery disease include obesity, dyslipidemia and post-menopausal state. The current treatment provides moderate improvement. There are no compliance problems.  Identifiable causes of hypertension include a thyroid problem.   Thyroid Problem   Patient reports no anxiety, constipation, diaphoresis, diarrhea, fatigue, menstrual problem, palpitations or tremors. Her past medical history is significant for hyperlipidemia.   Hyperlipidemia   Pertinent negatives include no chest pain, myalgias or shortness of breath.       The following portions of the patient's history were reviewed and updated as appropriate: allergies, current medications, past family history, past medical history, past social history, past surgical history and problem list.    Review of Systems   Constitutional:  Negative for activity change, appetite change, chills, diaphoresis, fatigue, fever and unexpected weight change.   HENT: Negative for congestion, ear pain, postnasal drip, rhinorrhea, sinus pressure, sneezing, sore throat and trouble swallowing.    Respiratory: Negative for apnea, cough, chest tightness, shortness of breath and wheezing.    Cardiovascular: Negative for chest pain, palpitations and leg swelling.   Gastrointestinal: Negative for abdominal distention, abdominal pain, blood in stool, constipation, diarrhea, nausea and vomiting.   Genitourinary: Negative for decreased urine volume, difficulty urinating, dyspareunia, dysuria, enuresis, flank pain, frequency, hematuria, menstrual problem and urgency.   Musculoskeletal: Negative for arthralgias, back pain, joint swelling, myalgias, neck pain and neck stiffness.   Skin: Negative for rash.   Neurological: Negative for dizziness, tremors, syncope, weakness, light-headedness, numbness and headaches.   Hematological: Negative for adenopathy.   Psychiatric/Behavioral: Negative for confusion, decreased concentration, dysphoric mood and sleep disturbance. The patient is not nervous/anxious.            Objective:    Physical Exam   Constitutional: She is oriented to person, place, and time. Vital signs are normal. She appears well-developed and well-nourished.   Neck: Normal range of motion. Neck supple. Carotid bruit is not present. No thyromegaly present.   Cardiovascular: Normal rate, regular rhythm, S1 normal, S2 normal, normal heart sounds and intact distal pulses.  Exam reveals no gallop and no friction rub.    No murmur heard.  Pulses:       Dorsalis pedis pulses are 2+ on the right side, and 2+ on the left side.        Posterior tibial pulses are 2+ on the right side, and 2+  on the left side.   Pulmonary/Chest: Effort normal and breath sounds normal.   Abdominal: Soft. Bowel sounds are normal.   Musculoskeletal: Normal range of motion. She exhibits no edema.    Lymphadenopathy:     She has no cervical adenopathy.   Neurological: She is alert and oriented to person, place, and time.   Skin: Skin is warm and dry. No rash noted.   Psychiatric: She has a normal mood and affect. Her behavior is normal.   Nursing note and vitals reviewed.    Assessment:     1. Essential hypertension    2. Mixed hyperlipidemia    3. Hypothyroidism, unspecified type      Plan:     Continue current medications  Ordered CBC, CMP, Lipid Panel, TSH reflex and T3 free    Continue to monitor caloric intake to help reduce weight    Return in about 6 months (around 12/26/2017).    By signing my name below, I, Dorthula Nettles, attest that this documentation has been prepared under the direction and in the presence of Beverely Pace, DNP.  Electronically Signed: Dorthula Nettles, Scribe. 06/28/2017 6:16 PM     I, Beverely Pace DNP, personally performed the services described in this documentation. All medical record entries made by the scribe were at my direction and in my presence. I have reviewed the chart and plan instructions and agree that the record reflects my personal performance and is accurate and complete.   Beverely Pace, DNP. 06/28/2017 6:16 PM

## 2017-07-12 ENCOUNTER — Ambulatory Visit (RURAL_HEALTH_CENTER): Payer: Medicare Other | Admitting: Family

## 2017-08-06 ENCOUNTER — Telehealth (RURAL_HEALTH_CENTER): Payer: Self-pay

## 2017-08-06 ENCOUNTER — Other Ambulatory Visit (RURAL_HEALTH_CENTER): Payer: Self-pay | Admitting: Family

## 2017-08-06 MED ORDER — PREDNISONE 10 MG PO TABS
ORAL_TABLET | ORAL | 0 refills | Status: DC
Start: 2017-08-06 — End: 2017-12-27

## 2017-08-06 NOTE — Telephone Encounter (Signed)
Patient has gotten into the poison ivy again and would like to know if [facility] would send her something in for that.  Please call and let know.

## 2017-08-06 NOTE — Telephone Encounter (Signed)
Rx sent to [facility]

## 2017-08-06 NOTE — Telephone Encounter (Signed)
Pt notified RX sent//jlm

## 2017-09-11 ENCOUNTER — Other Ambulatory Visit (RURAL_HEALTH_CENTER): Payer: Self-pay | Admitting: Family

## 2017-09-11 ENCOUNTER — Other Ambulatory Visit
Admission: RE | Admit: 2017-09-11 | Discharge: 2017-09-11 | Disposition: A | Discharge status: Home / Self Care | Payer: Medicare Other | Source: Ambulatory Visit | Attending: Family | Admitting: Family

## 2017-09-11 DIAGNOSIS — E039 Hypothyroidism, unspecified: Secondary | ICD-10-CM

## 2017-09-11 LAB — T3, FREE: T3, Free: 1.1 pg/mL — ABNORMAL LOW (ref 1.71–3.71)

## 2017-09-11 LAB — THYROID STIMULATING HORMONE (TSH), REFLEX ON ABNORMAL TO FREE T4, SERUM: TSH: 0.73 u[IU]/mL (ref 0.40–4.20)

## 2017-09-12 ENCOUNTER — Telehealth (RURAL_HEALTH_CENTER): Payer: Self-pay

## 2017-09-12 NOTE — Telephone Encounter (Signed)
Patient aware to continue current dose of thyroid med and expressed understanding

## 2017-09-12 NOTE — Telephone Encounter (Signed)
-----   Message from Beverely Pace, [place] NP sent at 09/12/2017  9:35 AM EDT -----  OK. Continue current dose thyroid med.

## 2017-10-03 ENCOUNTER — Other Ambulatory Visit (RURAL_HEALTH_CENTER): Payer: Self-pay | Admitting: Internal Medicine

## 2017-11-07 ENCOUNTER — Telehealth (RURAL_HEALTH_CENTER): Payer: Self-pay | Admitting: Family

## 2017-11-07 ENCOUNTER — Other Ambulatory Visit (RURAL_HEALTH_CENTER): Payer: Self-pay

## 2017-11-07 MED ORDER — CLONAZEPAM 1 MG PO TABS
ORAL_TABLET | ORAL | 0 refills | Status: DC
Start: 2017-11-07 — End: 2017-12-11

## 2017-11-07 NOTE — Telephone Encounter (Signed)
Pt would like a short term prescription of klonopin to [facility] as she is awaiting her mail order.

## 2017-11-27 ENCOUNTER — Other Ambulatory Visit (RURAL_HEALTH_CENTER): Payer: Self-pay | Admitting: Family

## 2017-11-29 ENCOUNTER — Other Ambulatory Visit (RURAL_HEALTH_CENTER): Payer: Self-pay | Admitting: Family

## 2017-12-11 ENCOUNTER — Other Ambulatory Visit (RURAL_HEALTH_CENTER): Payer: Self-pay

## 2017-12-11 MED ORDER — CLONAZEPAM 1 MG PO TABS
ORAL_TABLET | ORAL | 0 refills | Status: DC
Start: 2017-12-11 — End: 2018-01-16

## 2017-12-11 NOTE — Telephone Encounter (Signed)
Last fill date:   11/07/17-- 7 day supply-- [facility]   11/06/17-- 30 day supply-- [facility]    LME: 2.00    Last OV: 06/26/17  Next OV: 12/27/17

## 2017-12-11 NOTE — Telephone Encounter (Signed)
Refill on  1. Clonazepam for 3 month refill to go to [facility]

## 2017-12-18 ENCOUNTER — Other Ambulatory Visit (RURAL_HEALTH_CENTER): Payer: Self-pay | Admitting: Family

## 2017-12-27 ENCOUNTER — Other Ambulatory Visit
Admission: RE | Admit: 2017-12-27 | Discharge: 2017-12-27 | Disposition: A | Discharge status: Home / Self Care | Payer: Medicare Other | Source: Ambulatory Visit | Attending: Family | Admitting: Family

## 2017-12-27 ENCOUNTER — Ambulatory Visit: Payer: Medicare Other | Attending: Family | Admitting: Family

## 2017-12-27 ENCOUNTER — Encounter (RURAL_HEALTH_CENTER): Payer: Self-pay | Admitting: Family

## 2017-12-27 DIAGNOSIS — E782 Mixed hyperlipidemia: Secondary | ICD-10-CM

## 2017-12-27 DIAGNOSIS — Z23 Encounter for immunization: Secondary | ICD-10-CM

## 2017-12-27 DIAGNOSIS — E039 Hypothyroidism, unspecified: Secondary | ICD-10-CM

## 2017-12-27 DIAGNOSIS — I10 Essential (primary) hypertension: Secondary | ICD-10-CM

## 2017-12-27 LAB — CBC AND DIFFERENTIAL
Basophils %: 0.5 % (ref 0.0–3.0)
Basophils Absolute: 0 10*3/uL (ref 0.0–0.3)
Eosinophils %: 3.1 % (ref 0.0–7.0)
Eosinophils Absolute: 0.2 10*3/uL (ref 0.0–0.8)
Hematocrit: 38.7 % (ref 36.0–48.0)
Hemoglobin: 12.8 gm/dL (ref 12.0–16.0)
Lymphocytes Absolute: 1.3 10*3/uL (ref 0.6–5.1)
Lymphocytes: 20.4 % (ref 15.0–46.0)
MCH: 31 pg (ref 28–35)
MCHC: 33 gm/dL (ref 32–36)
MCV: 93 fL (ref 80–100)
MPV: 8.8 fL (ref 6.0–10.0)
Monocytes Absolute: 0.5 10*3/uL (ref 0.1–1.7)
Monocytes: 7.1 % (ref 3.0–15.0)
Neutrophils %: 68.9 % (ref 42.0–78.0)
Neutrophils Absolute: 4.4 10*3/uL (ref 1.7–8.6)
PLT CT: 267 10*3/uL (ref 130–440)
RBC: 4.14 10*6/uL (ref 3.80–5.00)
RDW: 12 % (ref 11.0–14.0)
WBC: 6.4 10*3/uL (ref 4.0–11.0)

## 2017-12-27 LAB — LIPID PANEL
Cholesterol: 155 mg/dL (ref 75–199)
Coronary Heart Disease Risk: 5.34
HDL: 29 mg/dL — ABNORMAL LOW (ref 45–65)
LDL Calculated: 95 mg/dL
Triglycerides: 155 mg/dL — ABNORMAL HIGH (ref 10–150)
VLDL: 31 (ref 0–40)

## 2017-12-27 LAB — COMPREHENSIVE METABOLIC PANEL
ALT: 14 U/L (ref 0–55)
AST (SGOT): 23 U/L (ref 10–42)
Albumin/Globulin Ratio: 1.39 Ratio (ref 0.80–2.00)
Albumin: 4.3 gm/dL (ref 3.5–5.0)
Alkaline Phosphatase: 46 U/L (ref 40–145)
Anion Gap: 15 mMol/L (ref 7.0–18.0)
BUN / Creatinine Ratio: 25.2 Ratio (ref 10.0–30.0)
BUN: 37 mg/dL — ABNORMAL HIGH (ref 7–22)
Bilirubin, Total: 0.5 mg/dL (ref 0.1–1.2)
CO2: 26.6 mMol/L (ref 20.0–30.0)
Calcium: 10.2 mg/dL (ref 8.5–10.5)
Chloride: 106 mMol/L (ref 98–110)
Creatinine: 1.47 mg/dL — ABNORMAL HIGH (ref 0.60–1.20)
EGFR: 33 mL/min/{1.73_m2} — ABNORMAL LOW (ref 60–150)
Globulin: 3.1 gm/dL (ref 2.0–4.0)
Glucose: 96 mg/dL (ref 71–99)
Osmolality Calc: 294 mOsm/kg (ref 275–300)
Potassium: 4.6 mMol/L (ref 3.5–5.3)
Protein, Total: 7.4 gm/dL (ref 6.0–8.3)
Sodium: 143 mMol/L (ref 136–147)

## 2017-12-27 LAB — T3, FREE: T3, Free: 1.3 pg/mL — ABNORMAL LOW (ref 1.71–3.71)

## 2017-12-27 NOTE — Progress Notes (Signed)
Subjective:    Patient ID: Cassandra [name] is a 82 y.o. female.      Cassandra [name] is a 82 yo female who presents with hypertension, hyperlipidemia, and hypothyroidism. She is in the office today for a medical management follow up. She is feeling well today with no new medical complaints or issues. Last labs showed a total cholesterol of 170, total triglycerides of 177, and a HDL of 30 on 06/26/17.    Hypertension   This is a chronic problem. The current episode started more than 1 year ago. The problem is unchanged. The problem is controlled. Pertinent negatives include no chest pain, headaches, neck pain, palpitations or shortness of breath. There are no associated agents to hypertension. Risk factors for coronary artery disease include sedentary lifestyle, post-menopausal state and dyslipidemia. The current treatment provides moderate improvement. There are no compliance problems.  Identifiable causes of hypertension include a thyroid problem.   Thyroid Problem   Presents for follow-up visit. Patient reports no anxiety, constipation, diaphoresis, diarrhea, fatigue, menstrual problem, palpitations or tremors. The symptoms have been stable. Her past medical history is significant for hyperlipidemia.   Hyperlipidemia   This is a chronic problem. The current episode started more than 1 year ago. The problem is controlled. Recent lipid tests were reviewed and are variable. Exacerbating diseases include hypothyroidism. There are no known factors aggravating her hyperlipidemia. Pertinent negatives include no chest pain, myalgias or shortness of breath. Current antihyperlipidemic treatment includes statins. The current treatment provides moderate improvement of lipids. There are no compliance problems.  Risk factors for coronary artery disease include dyslipidemia, hypertension, obesity, post-menopausal and a sedentary lifestyle.     The following portions of the patient's history were reviewed and updated as appropriate:  allergies, current medications, past family history, past medical history, past social history, past surgical history and problem list.    Review of Systems   Constitutional: Negative for activity change, appetite change, chills, diaphoresis, fatigue, fever and unexpected weight change.   HENT: Negative for congestion, ear pain, postnasal drip, rhinorrhea, sinus pressure, sneezing, sore throat and trouble swallowing.    Respiratory: Negative for apnea, cough, chest tightness, shortness of breath and wheezing.    Cardiovascular: Negative for chest pain, palpitations and leg swelling.   Gastrointestinal: Negative for abdominal distention, abdominal pain, blood in stool, constipation, diarrhea, nausea and vomiting.   Genitourinary: Negative for decreased urine volume, difficulty urinating, dyspareunia, dysuria, enuresis, flank pain, frequency, hematuria, menstrual problem and urgency.   Musculoskeletal: Negative for arthralgias, back pain, joint swelling, myalgias, neck pain and neck stiffness.   Skin: Negative for rash.   Neurological: Negative for dizziness, tremors, syncope, weakness, light-headedness, numbness and headaches.   Hematological: Negative for adenopathy.   Psychiatric/Behavioral: Negative for confusion, decreased concentration, dysphoric mood and sleep disturbance. The patient is not nervous/anxious.      Objective:    Physical Exam  Vitals signs and nursing note reviewed.   Constitutional:       General: She is awake.      Appearance: Normal appearance. She is well-developed, well-groomed and overweight.   Neck:      Musculoskeletal: Normal range of motion and neck supple.      Thyroid: No thyromegaly.      Vascular: No carotid bruit or JVD.   Cardiovascular:      Rate and Rhythm: Normal rate and regular rhythm.      Pulses:           Dorsalis pedis pulses are  2+ on the right side and 2+ on the left side.        Posterior tibial pulses are 2+ on the right side and 2+ on the left side.      Heart  sounds: Normal heart sounds, S1 normal and S2 normal. No murmur. No friction rub. No gallop.    Pulmonary:      Effort: Pulmonary effort is normal. No respiratory distress.      Breath sounds: Normal breath sounds. No wheezing or rales.   Abdominal:      General: Bowel sounds are normal. There is no distension.      Palpations: Abdomen is soft. There is no mass.      Tenderness: There is no tenderness. There is no guarding or rebound.   Musculoskeletal: Normal range of motion.         General: No tenderness.      Comments: Varicosities in the lower extremities    Lymphadenopathy:      Cervical: No cervical adenopathy.   Skin:     General: Skin is warm and dry.      Findings: No rash.   Neurological:      Mental Status: She is alert and oriented to person, place, and time.   Psychiatric:         Attention and Perception: Attention and perception normal.         Mood and Affect: Mood and affect normal.         Speech: Speech normal.         Behavior: Behavior normal. Behavior is cooperative.         Thought Content: Thought content normal.         Cognition and Memory: Cognition and memory normal.         Judgment: Judgment normal.       Assessment:       1. Need for 23-polyvalent pneumococcal polysaccharide vaccine    2. Essential hypertension    3. Hypothyroidism, unspecified type    4. Mixed hyperlipidemia      Plan:     Return in about 3 months (around 03/29/2018).    Fall Risk Assessment   Depression Screen score   Pt declines Medicare Annual Wellness   Pt will get Influenza vaccine when available  Ordered and administered pneumococcal vaccine - P23     Continue current medications  Ordered CMP, CBC, Lipid Panel, TSH reflex to T4 and T3 free       By signing my name below, I, Cassandra [name], attest that this documentation has been prepared under the direction and in the presence of Beverely Pace, DNP.  Electronically Signed: Dorthula [name], Scribe. 12/27/2017 2:05 PM     I, Beverely Pace DNP, personally performed  the services described in this documentation. All medical record entries made by the scribe were at my direction and in my presence. I have reviewed the chart and plan instructions and agree that three record reflects my personal performance and is accurate and complete.   Beverely Pace, DNP. 12/27/2017 2:05 PM

## 2017-12-28 LAB — THYROID STIMULATING HORMONE (TSH), REFLEX ON ABNORMAL TO FREE T4, SERUM
T4 Free: 1.22 ng/dL (ref 0.70–1.48)
TSH: 0.28 u[IU]/mL — ABNORMAL LOW (ref 0.40–4.20)

## 2017-12-30 ENCOUNTER — Other Ambulatory Visit (RURAL_HEALTH_CENTER): Payer: Self-pay | Admitting: Family

## 2017-12-30 DIAGNOSIS — E039 Hypothyroidism, unspecified: Secondary | ICD-10-CM

## 2017-12-30 MED ORDER — LEVOTHYROXINE SODIUM 100 MCG PO TABS
100.00 ug | ORAL_TABLET | Freq: Every day | ORAL | 3 refills | Status: DC
Start: 2017-12-30 — End: 2018-05-18

## 2018-01-03 ENCOUNTER — Telehealth (RURAL_HEALTH_CENTER): Payer: Self-pay

## 2018-01-03 NOTE — Telephone Encounter (Addendum)
Cassandra [name], Pt called and is requesting results of labs..she states she picked up a new Rx for levothyroxine and was unsure why... she states she did notice there was a change in the Rx......Marland Kitchenpkt

## 2018-01-03 NOTE — Telephone Encounter (Signed)
Left Message for pt to return call.

## 2018-01-03 NOTE — Telephone Encounter (Signed)
tELL HER SHE IS RECEIVING TOO MUCH THYROID MED. Did she not get this explained? Or is she My Chart active?

## 2018-01-07 NOTE — Telephone Encounter (Signed)
Left message to call back  

## 2018-01-08 NOTE — Telephone Encounter (Signed)
Lm with husband to return call. cf

## 2018-01-08 NOTE — Telephone Encounter (Signed)
Pt has been informed.....pkt

## 2018-01-16 ENCOUNTER — Other Ambulatory Visit (RURAL_HEALTH_CENTER): Payer: Self-pay | Admitting: Internal Medicine

## 2018-01-17 ENCOUNTER — Telehealth (RURAL_HEALTH_CENTER): Payer: Self-pay

## 2018-01-17 ENCOUNTER — Other Ambulatory Visit (RURAL_HEALTH_CENTER): Payer: Self-pay | Admitting: Family

## 2018-01-17 ENCOUNTER — Other Ambulatory Visit (RURAL_HEALTH_CENTER): Payer: Self-pay

## 2018-01-17 MED ORDER — CLONAZEPAM 1 MG PO TABS
ORAL_TABLET | ORAL | 0 refills | Status: DC
Start: 2018-01-17 — End: 2018-01-17

## 2018-01-17 MED ORDER — CLONAZEPAM 1 MG PO TABS
ORAL_TABLET | ORAL | 1 refills | Status: DC
Start: 2018-01-17 — End: 2018-05-06

## 2018-01-17 NOTE — Telephone Encounter (Signed)
error 

## 2018-02-01 ENCOUNTER — Encounter (RURAL_HEALTH_CENTER): Payer: Self-pay

## 2018-02-13 ENCOUNTER — Encounter (INDEPENDENT_AMBULATORY_CARE_PROVIDER_SITE_OTHER): Payer: Self-pay

## 2018-03-13 ENCOUNTER — Encounter (INDEPENDENT_AMBULATORY_CARE_PROVIDER_SITE_OTHER): Payer: Self-pay

## 2018-04-03 ENCOUNTER — Encounter (INDEPENDENT_AMBULATORY_CARE_PROVIDER_SITE_OTHER): Payer: Self-pay

## 2018-04-10 ENCOUNTER — Other Ambulatory Visit (RURAL_HEALTH_CENTER): Payer: Self-pay | Admitting: Family

## 2018-04-29 ENCOUNTER — Ambulatory Visit (RURAL_HEALTH_CENTER): Payer: Medicare Other | Admitting: Family

## 2018-05-06 ENCOUNTER — Other Ambulatory Visit (RURAL_HEALTH_CENTER): Payer: Self-pay | Admitting: Family

## 2018-05-06 MED ORDER — CLONAZEPAM 1 MG PO TABS
ORAL_TABLET | ORAL | 1 refills | Status: DC
Start: 2018-05-06 — End: 2018-07-31

## 2018-05-06 NOTE — Telephone Encounter (Signed)
Rx please.

## 2018-05-06 NOTE — Telephone Encounter (Signed)
Pt called needs clonazapam sent to [facility]. Pt is out and is not sleeping and inquires if this could be increased.

## 2018-05-06 NOTE — Telephone Encounter (Signed)
See patients message below about increasing prescription

## 2018-05-08 ENCOUNTER — Encounter (INDEPENDENT_AMBULATORY_CARE_PROVIDER_SITE_OTHER): Payer: Self-pay

## 2018-05-13 ENCOUNTER — Encounter (RURAL_HEALTH_CENTER): Payer: Self-pay | Admitting: Family

## 2018-05-13 ENCOUNTER — Other Ambulatory Visit
Admission: RE | Admit: 2018-05-13 | Discharge: 2018-05-13 | Disposition: A | Discharge status: Home / Self Care | Payer: Medicare Other | Source: Ambulatory Visit | Attending: Family | Admitting: Family

## 2018-05-13 ENCOUNTER — Ambulatory Visit: Payer: Medicare Other | Attending: Family | Admitting: Family

## 2018-05-13 DIAGNOSIS — E782 Mixed hyperlipidemia: Secondary | ICD-10-CM

## 2018-05-13 DIAGNOSIS — I10 Essential (primary) hypertension: Secondary | ICD-10-CM

## 2018-05-13 DIAGNOSIS — E039 Hypothyroidism, unspecified: Secondary | ICD-10-CM

## 2018-05-13 DIAGNOSIS — G2581 Restless legs syndrome: Secondary | ICD-10-CM

## 2018-05-13 DIAGNOSIS — M79605 Pain in left leg: Secondary | ICD-10-CM

## 2018-05-13 HISTORY — DX: Pain in left leg: M79.605

## 2018-05-13 NOTE — Progress Notes (Signed)
PROGRESS NOTE    Patient Name: Cassandra [name],Cassandra [name]    Subjective   History of Present Illness:   Cassandra [name] is a 83 y.o. female who presents with:  Chief Complaint   Patient presents with    Hypertension     HTN:   The patient is currently taking losartan-Hydrochlorothiazide 12.5 mg per day  for HTN control. BP in the office today is 128/88. She denies chest pain, lower extremity edema or palpitations.    Hypothyroidism:   The patient is currently taking levothyroxine 100 mcg daily. The patient denies symptoms of hypothyroidism including fatigue, cold intolerance, dry skin, scalp hair thinning, constipation, depression or weight gain.    Hyperlipidemia:   She is currently taking Lovastatin  for hyperlipidemia. Last labs showed total cholesterol of 155 with a HDL of 29, LDL of 95, and Triglycerides of 155 on 12/27/2017. Patient has been working on walking on the treadmill and goes about a mile and a half a day.    Health Maintenance  Patient reports that she is fasting for labs today.          The following portions of the patient's history were reviewed and updated as appropriate: allergies, current medications, past family history, past medical history, past social history, past surgical history and problem list.       Problem List:     Patient Active Problem List   Diagnosis    Hyperlipidemia    Hypothyroidism    Osteoarthritis of shoulder    Essential hypertension    Onychomycosis of toenail    RLS (restless legs syndrome)    Symptomatic lesion of skin    Leg pain, lateral, left          Medications:     Prior to Admission medications    Medication Sig Start Date End Date Taking? Authorizing Provider   Ascorbic Acid (VITA-C PO) Take 180 mg by mouth 2 (two) times daily       Yes [provider]   Bioflavonoid Products (ESTER C PO) Take by mouth   Yes [provider]   Cetirizine HCl (KLS ALLER-TEC PO) Take by mouth   Yes [provider]   clonazePAM (KLONOPIN) 1 MG tablet  1 PO QHS 05/06/18  Yes Beverely Pace, DNP NP   Coenzyme Q10 (CO Q 10 PO) Take 200 mg by mouth daily.       Yes [provider]   CRANBERRY CONCENTRATE PO Take 4,200 mg by mouth daily.   Yes [provider]   diclofenac (VOLTAREN) 75 MG EC tablet TAKE 1 TABLET TWICE DAILY AS NEEDED FOR PAIN 10/03/17  Yes Graciela Husbands, DO   fenofibrate (LOFIBRA) 160 MG tablet TAKE 1 TABLET (160 MG TOTAL) BY MOUTH DAILY. 05/19/17  Yes Beverely Pace, DNP NP   Glucos-Chond-Hyal Ac-Ca Fructo (MOVE FREE JOINT HEALTH ADVANCE PO) Take by mouth   Yes [provider]   ibandronate (BONIVA) 150 MG tablet TAKE 1 TABLET BY MOUTH ONCE EVERY 30 DAYS 11/28/17  Yes Beverely Pace, DNP NP   levothyroxine (SYNTHROID, LEVOTHROID) 100 MCG tablet Take 1 tablet (100 mcg total) by mouth Once a day at 6:00am 12/30/17  Yes Beverely Pace, DNP NP   losartan-hydrochlorothiazide (HYZAAR) 50-12.5 MG per tablet TAKE 1 TABLET EVERY DAY 12/19/17  Yes Graciela Husbands, DO   lovastatin (MEVACOR) 20 MG tablet TAKE ONE TABLET ONCE DAILY FOR CHOLESTEROL 12/19/17  Yes Graciela Husbands, DO   Multiple Vitamin (MULTIVITAMIN) tablet Take 1  tablet by mouth daily.   Yes [provider]   Multiple Vitamins-Minerals (HAIR/SKIN/NAILS/BIOTIN PO) Take 1 tablet by mouth daily.   Yes [provider]   pantoprazole (PROTONIX) 40 MG tablet TAKE 1 TABLET EVERY DAY 12/19/17  Yes Graciela Husbands, DO   Polyethylene Glycol 3350 (MIRALAX PO) Take by mouth daily.   Yes [provider]   potassium chloride (K-DUR,KLOR-CON) 20 MEQ tablet TAKE 1 TABLET EVERY DAY 12/19/17  Yes Graciela Husbands, DO   Turmeric 500 MG Tab Take 1 tablet by mouth daily.   Yes [provider]   venlafaxine (EFFEXOR-XR) 75 MG 24 hr capsule TAKE 1 CAPSULE EVERY DAY 12/19/17  Yes Graciela Husbands, DO   diphenhydrAMINE-acetaminophen (TYLENOL PM) 25-500 MG Tab Take 1 tablet by mouth nightly as needed.  05/13/18  [provider]   ibandronate (BONIVA) 150 MG tablet TAKE 1 TABLET BY  MOUTH ONCE EVERY 30 DAYS 04/11/18 05/13/18  Beverely Pace, DNP NP        Review of Systems:   Review of Systems   Constitutional: Negative for fever and malaise/fatigue.   HENT: Negative for congestion.    Eyes: Negative for blurred vision and pain.   Respiratory: Negative for cough.    Cardiovascular: Negative for chest pain.   Gastrointestinal: Negative for abdominal pain, constipation, diarrhea, heartburn, nausea and vomiting.   Genitourinary: Negative for dysuria.   Skin: Negative for itching and rash.   Neurological: Negative for dizziness, weakness and headaches.   Psychiatric/Behavioral: Negative for depression.       Physical Exam:      Vitals:    05/13/18 1338   BP: 128/88   BP Site: Left arm   Patient Position: Sitting   Cuff Size: Medium   Pulse: 79   Resp: 16   Temp: 97.7 F (36.5 C)   TempSrc: Temporal   SpO2: 94%   Weight: 71.7 kg (158 lb)   Height: 1.626 m (5' 4.02")        Physical Exam  Constitutional:       Appearance: Normal appearance.   HENT:      Right Ear: Tympanic membrane normal.      Left Ear: Tympanic membrane normal.   Eyes:      Pupils: Pupils are equal, round, and reactive to light.   Neck:      Vascular: No carotid bruit.   Cardiovascular:      Rate and Rhythm: Normal rate and regular rhythm.      Heart sounds: S1 normal and S2 normal. No murmur.   Pulmonary:      Effort: Pulmonary effort is normal.      Breath sounds: Normal breath sounds.   Abdominal:      General: Bowel sounds are normal.   Musculoskeletal: Normal range of motion.   Lymphadenopathy:      Cervical: No cervical adenopathy.   Skin:     General: Skin is warm and dry.   Neurological:      Mental Status: She is alert.            Assessment:      Encounter Diagnoses   Name Primary?    Essential hypertension     Hypothyroidism, unspecified type     Mixed hyperlipidemia     RLS (restless legs syndrome)     Leg pain, lateral, left           Plan:     Orders Placed This Encounter   Procedures  CBC and differential      Standing Status:   Future     Standing Expiration Date:   07/12/2018    Comprehensive metabolic panel     Standing Status:   Future     Standing Expiration Date:   07/12/2018     Order Specific Question:   Has the patient fasted?     Answer:   Yes    T3, free     Standing Status:   Future     Standing Expiration Date:   07/12/2018    TSH, Abn Reflex to Free T4, Serum     Standing Status:   Future     Standing Expiration Date:   07/12/2018    Lipid panel     Standing Status:   Future     Standing Expiration Date:   07/12/2018     Order Specific Question:   Has the patient fasted?     Answer:   Yes       Requested Prescriptions      No prescriptions requested or ordered in this encounter       Continue current medications.    Return in about 4 months (around 09/11/2018).    By signing my name below, I, Bridget Silvious, attest that this documentation has been prepared under the direction and in the presence of Beverely Pace, DNP.  Electronically Signed: Dorice Lamas, Scribe. 05/13/2018 2:15 PM      I, Beverely Pace, DNP , personally performed the services described in this documentation. All medical record entries made by the scribe were at my direction and in my presence. I have reviewed the chart and plan instructions and agree that the record reflects my personal performance and is accurate and complete.   05/13/2018 2:15 PM

## 2018-05-14 ENCOUNTER — Telehealth (RURAL_HEALTH_CENTER): Payer: Self-pay | Admitting: Family

## 2018-05-14 LAB — COMPREHENSIVE METABOLIC PANEL
ALT: 15 U/L (ref 0–55)
AST (SGOT): 25 U/L (ref 10–42)
Albumin/Globulin Ratio: 1.46 Ratio (ref 0.80–2.00)
Albumin: 4.1 gm/dL (ref 3.5–5.0)
Alkaline Phosphatase: 37 U/L — ABNORMAL LOW (ref 40–145)
Anion Gap: 16.9 mMol/L (ref 7.0–18.0)
BUN / Creatinine Ratio: 20.1 Ratio (ref 10.0–30.0)
BUN: 33 mg/dL — ABNORMAL HIGH (ref 7–22)
Bilirubin, Total: 0.5 mg/dL (ref 0.1–1.2)
CO2: 25 mMol/L (ref 20–30)
Calcium: 10.6 mg/dL — ABNORMAL HIGH (ref 8.5–10.5)
Chloride: 105 mMol/L (ref 98–110)
Creatinine: 1.64 mg/dL — ABNORMAL HIGH (ref 0.60–1.20)
EGFR: 29 mL/min/{1.73_m2} — ABNORMAL LOW (ref 60–150)
Globulin: 2.8 gm/dL (ref 2.0–4.0)
Glucose: 93 mg/dL (ref 71–99)
Osmolality Calculated: 290 mOsm/kg (ref 275–300)
Potassium: 4.9 mMol/L (ref 3.5–5.3)
Protein, Total: 6.9 gm/dL (ref 6.0–8.3)
Sodium: 142 mMol/L (ref 136–147)

## 2018-05-14 LAB — CBC AND DIFFERENTIAL
Basophils %: 1.1 % (ref 0.0–3.0)
Basophils Absolute: 0.1 10*3/uL (ref 0.0–0.3)
Eosinophils %: 2.6 % (ref 0.0–7.0)
Eosinophils Absolute: 0.1 10*3/uL (ref 0.0–0.8)
Hematocrit: 38.9 % (ref 36.0–48.0)
Hemoglobin: 12.4 gm/dL (ref 12.0–16.0)
Lymphocytes Absolute: 1.2 10*3/uL (ref 0.6–5.1)
Lymphocytes: 23.3 % (ref 15.0–46.0)
MCH: 30 pg (ref 28–35)
MCHC: 32 gm/dL (ref 32–36)
MCV: 95 fL (ref 80–100)
MPV: 9.1 fL (ref 6.0–10.0)
Monocytes Absolute: 0.4 10*3/uL (ref 0.1–1.7)
Monocytes: 8.6 % (ref 3.0–15.0)
Neutrophils %: 64.4 % (ref 42.0–78.0)
Neutrophils Absolute: 3.3 10*3/uL (ref 1.7–8.6)
PLT CT: 278 10*3/uL (ref 130–440)
RBC: 4.1 10*6/uL (ref 3.80–5.00)
RDW: 12.6 % (ref 11.0–14.0)
WBC: 5.2 10*3/uL (ref 4.0–11.0)

## 2018-05-14 LAB — LIPID PANEL
Cholesterol: 166 mg/dL (ref 75–199)
Coronary Heart Disease Risk: 4.88
HDL: 34 mg/dL — ABNORMAL LOW (ref 45–65)
LDL Calculated: 104 mg/dL
Triglycerides: 140 mg/dL (ref 10–150)
VLDL: 28 (ref 0–40)

## 2018-05-14 LAB — T3, FREE: T3, Free: 1.2 pg/mL — ABNORMAL LOW (ref 1.71–3.71)

## 2018-05-14 LAB — THYROID STIMULATING HORMONE (TSH), REFLEX ON ABNORMAL TO FREE T4, SERUM: TSH: 1.64 u[IU]/mL (ref 0.40–4.20)

## 2018-05-14 NOTE — Telephone Encounter (Signed)
Pt reports that she is taking a multivitamin that has calcium 300mg  (1 a day)  and she took tums maybe 5 times this month. She reports she also takes her bone pill once a month. Nothing else.

## 2018-05-14 NOTE — Telephone Encounter (Signed)
-----   Message from Beverely Pace, Elysian NP sent at 05/14/2018  3:33 PM EST -----  Has she been taking calcium supplements or Tums?

## 2018-05-16 NOTE — Telephone Encounter (Signed)
OK. Will recheck labs aT NEXT VISIT.

## 2018-05-17 ENCOUNTER — Other Ambulatory Visit (RURAL_HEALTH_CENTER): Payer: Self-pay | Admitting: Family

## 2018-06-02 ENCOUNTER — Encounter (INDEPENDENT_AMBULATORY_CARE_PROVIDER_SITE_OTHER): Payer: Self-pay

## 2018-06-20 ENCOUNTER — Telehealth (RURAL_HEALTH_CENTER): Payer: Self-pay | Admitting: Family

## 2018-06-20 NOTE — Telephone Encounter (Signed)
See message.

## 2018-06-20 NOTE — Telephone Encounter (Signed)
Go on a BRAT diet. Monitor temperature. Come in tomorrow if not OK.

## 2018-06-20 NOTE — Telephone Encounter (Signed)
Pt called stating she is not feeling well, no cough, no fever BUT has nausea, not sleeping well, diarrhea and gas. Pt concerned and would like to see what recommendations we may have. (she has stopped her Miralax this AM) she can be reached at [phone].

## 2018-06-21 NOTE — Telephone Encounter (Signed)
Patient is aware , she said she is feeling some better , she will call back if she decides she needs to come in to be seen. I told her to call back soon you only work until 12.

## 2018-06-27 ENCOUNTER — Other Ambulatory Visit (RURAL_HEALTH_CENTER): Payer: Self-pay | Admitting: Family

## 2018-07-03 ENCOUNTER — Encounter (INDEPENDENT_AMBULATORY_CARE_PROVIDER_SITE_OTHER): Payer: Self-pay

## 2018-07-15 ENCOUNTER — Other Ambulatory Visit (RURAL_HEALTH_CENTER): Payer: Self-pay

## 2018-07-15 MED ORDER — FENOFIBRATE 160 MG PO TABS
160.0000 mg | ORAL_TABLET | Freq: Every day | ORAL | 3 refills | Status: DC
Start: 2018-07-15 — End: 2019-07-20

## 2018-07-15 MED ORDER — IBANDRONATE SODIUM 150 MG PO TABS
ORAL_TABLET | ORAL | 5 refills | Status: DC
Start: 2018-07-15 — End: 2019-01-03

## 2018-07-15 NOTE — Telephone Encounter (Signed)
Refill on   1.  Fenofibrate 90 day supply  2.  Ibandronate  To go to [facility]

## 2018-07-15 NOTE — Telephone Encounter (Signed)
Last seen 05/13/2018

## 2018-07-31 ENCOUNTER — Other Ambulatory Visit (RURAL_HEALTH_CENTER): Payer: Self-pay

## 2018-07-31 NOTE — Telephone Encounter (Signed)
Refill on   1.  Clonazepam   2.  Diclofenac  These need to go to [facility]    Patient also needs her mail order changed to   [facility]  Phone [phone]  Fax [phone]

## 2018-08-01 MED ORDER — CLONAZEPAM 1 MG PO TABS
ORAL_TABLET | ORAL | 1 refills | Status: DC
Start: 2018-08-01 — End: 2018-09-24

## 2018-08-01 MED ORDER — DICLOFENAC SODIUM 75 MG PO TBEC
DELAYED_RELEASE_TABLET | ORAL | 1 refills | Status: DC
Start: 2018-08-01 — End: 2019-01-27

## 2018-08-06 ENCOUNTER — Other Ambulatory Visit (RURAL_HEALTH_CENTER): Payer: Self-pay | Admitting: Family

## 2018-09-24 ENCOUNTER — Other Ambulatory Visit
Admission: RE | Admit: 2018-09-24 | Discharge: 2018-09-24 | Disposition: A | Discharge status: Home / Self Care | Payer: Medicare Other | Source: Ambulatory Visit | Attending: Family | Admitting: Family

## 2018-09-24 ENCOUNTER — Ambulatory Visit: Payer: Medicare Other | Attending: Family | Admitting: Family

## 2018-09-24 ENCOUNTER — Other Ambulatory Visit (RURAL_HEALTH_CENTER): Payer: Self-pay

## 2018-09-24 ENCOUNTER — Encounter (RURAL_HEALTH_CENTER): Payer: Self-pay | Admitting: Family

## 2018-09-24 DIAGNOSIS — E039 Hypothyroidism, unspecified: Secondary | ICD-10-CM

## 2018-09-24 DIAGNOSIS — I10 Essential (primary) hypertension: Secondary | ICD-10-CM

## 2018-09-24 DIAGNOSIS — G4709 Other insomnia: Secondary | ICD-10-CM

## 2018-09-24 DIAGNOSIS — E782 Mixed hyperlipidemia: Secondary | ICD-10-CM

## 2018-09-24 HISTORY — DX: Other insomnia: G47.09

## 2018-09-24 LAB — COMPREHENSIVE METABOLIC PANEL
ALT: 19 U/L (ref 0–55)
AST (SGOT): 27 U/L (ref 10–42)
Albumin/Globulin Ratio: 1.59 Ratio (ref 0.80–2.00)
Albumin: 4.3 gm/dL (ref 3.5–5.0)
Alkaline Phosphatase: 34 U/L — ABNORMAL LOW (ref 40–145)
Anion Gap: 16.8 mMol/L (ref 7.0–18.0)
BUN / Creatinine Ratio: 19.8 Ratio (ref 10.0–30.0)
BUN: 33 mg/dL — ABNORMAL HIGH (ref 7–22)
Bilirubin, Total: 0.4 mg/dL (ref 0.1–1.2)
CO2: 24 mMol/L (ref 20–30)
Calcium: 9.8 mg/dL (ref 8.5–10.5)
Chloride: 108 mMol/L (ref 98–110)
Creatinine: 1.67 mg/dL — ABNORMAL HIGH (ref 0.60–1.20)
EGFR: 28 mL/min/{1.73_m2} — ABNORMAL LOW (ref 60–150)
Globulin: 2.7 gm/dL (ref 2.0–4.0)
Glucose: 87 mg/dL (ref 71–99)
Osmolality Calculated: 293 mOsm/kg (ref 275–300)
Potassium: 4.8 mMol/L (ref 3.5–5.3)
Protein, Total: 7 gm/dL (ref 6.0–8.3)
Sodium: 144 mMol/L (ref 136–147)

## 2018-09-24 LAB — CBC AND DIFFERENTIAL
Basophils %: 0.8 % (ref 0.0–3.0)
Basophils Absolute: 0 10*3/uL (ref 0.0–0.3)
Eosinophils %: 5.2 % (ref 0.0–7.0)
Eosinophils Absolute: 0.3 10*3/uL (ref 0.0–0.8)
Hematocrit: 39.2 % (ref 36.0–48.0)
Hemoglobin: 12.5 gm/dL (ref 12.0–16.0)
Lymphocytes Absolute: 1.3 10*3/uL (ref 0.6–5.1)
Lymphocytes: 22.2 % (ref 15.0–46.0)
MCH: 31 pg (ref 28–35)
MCHC: 32 gm/dL (ref 32–36)
MCV: 96 fL (ref 80–100)
MPV: 8.2 fL (ref 6.0–10.0)
Monocytes Absolute: 0.4 10*3/uL (ref 0.1–1.7)
Monocytes: 7.6 % (ref 3.0–15.0)
Neutrophils %: 64.2 % (ref 42.0–78.0)
Neutrophils Absolute: 3.7 10*3/uL (ref 1.7–8.6)
PLT CT: 254 10*3/uL (ref 130–440)
RBC: 4.09 10*6/uL (ref 3.80–5.00)
RDW: 12.4 % (ref 11.0–14.0)
WBC: 5.7 10*3/uL (ref 4.0–11.0)

## 2018-09-24 LAB — LIPID PANEL
Cholesterol: 158 mg/dL (ref 75–199)
Coronary Heart Disease Risk: 4.39
HDL: 36 mg/dL — ABNORMAL LOW (ref 45–65)
LDL Calculated: 92 mg/dL
Triglycerides: 149 mg/dL (ref 10–150)
VLDL: 30 (ref 0–40)

## 2018-09-24 LAB — THYROID STIMULATING HORMONE (TSH), REFLEX ON ABNORMAL TO FREE T4, SERUM: TSH: 3 u[IU]/mL (ref 0.40–4.20)

## 2018-09-24 LAB — T3, FREE: T3, Free: 2.1 pg/mL (ref 1.71–3.71)

## 2018-09-24 MED ORDER — CLONAZEPAM 1 MG PO TABS
ORAL_TABLET | ORAL | 1 refills | Status: DC
Start: 2018-09-24 — End: 2019-04-14

## 2018-09-24 NOTE — Progress Notes (Signed)
Patient Name: Cassandra [name]    Subjective   History of Present Illness:   Cassandra [name] is a 83 y.o. female who presents with:  Chief Complaint   Patient presents with    Hypertension     4 month f/u     Hypertension:  The patient is currently taking Losartan-Hydrochlorothiazide 50-12.5 mg QD for HTN control. BP in the office today is 115/61. She denies chest pain, lower extremity edema or palpitations.    Hyperlipidemia:   She is currently taking Fenofibrate  and Lovastatin  for hyperlipidemia. Last labs showed total cholesterol of 166 with a HDL of 34, LDL of 104, and Triglycerides of 140 on 05/14/2018.     Hypothyroidism:   The patient is currently taking 100 mcg of Euthyrox daily. The patient denies symptoms of hypothyroidism including fatigue, cold intolerance, dry skin, scalp hair thinning, constipation, depression or weight gain.    Back Problem:  When she is working for long periods of time she gets this, "strange muscle feeling" along her mid back. She describes the discomfort as aching. It doesn't matter if she is gardening, in the kitchen, doing laundry, or just standing for long periods of time she notices the muscles aching. When she experiences the aching sensation she takes a break from whatever it is she is doing.     Insomnia:  Cassandra [name] is having trouble sleeping. She takes Clonazepam 1 mg QHS but she states it isn't working well. She takes the Clonazepam around 7pm and she likes to read before she goes to bed. She doesn't nap during the day. She is easily disturbed by her husbands breathing and other noises at night. Sometimes she will go into the guest room and cut the fan on and then she is able to sleep. When she can't sleep anywhere she goes in the house, she goes to get into the bath.   She is adding OTC sleep aids like Nye-quill, but that makes her sleep really late into the morning. She is curious if she can increase her Clonazepam dosage or try another medication.     Health  Maintenance:  Cassandra [name] does have an advance directive completed at this time.     Cassandra [name] states that the mosquitoes are really bad this year and she has bites all over her legs! She hasn't used any bug spray up until this point.     The following portions of the patient's history were reviewed and updated as appropriate: allergies, current medications, past family history, past medical history, past social history, past surgical history and problem list.       Problem List:     Patient Active Problem List   Diagnosis    Hyperlipidemia    Hypothyroidism    Osteoarthritis of shoulder    Essential hypertension    Onychomycosis of toenail    RLS (restless legs syndrome)    Symptomatic lesion of skin    Leg pain, lateral, left    Other insomnia          Medications:     Prior to Admission medications    Medication Sig Start Date End Date Taking? Authorizing Provider   Ascorbic Acid (VITA-C PO) Take 180 mg by mouth 2 (two) times daily        [provider]   Bioflavonoid Products (ESTER C PO) Take by mouth    [provider]   Cetirizine HCl (KLS ALLER-TEC PO) Take by mouth  [provider]   clonazePAM (KLONOPIN) 1 MG tablet 1 PO QHS 08/01/18   Beverely Pace, DNP NP   Coenzyme Q10 (CO Q 10 PO) Take 200 mg by mouth daily.        [provider]   CRANBERRY CONCENTRATE PO Take 4,200 mg by mouth daily.    [provider]   diclofenac (VOLTAREN) 75 MG EC tablet TAKE 1 TABLET TWICE DAILY AS NEEDED FOR PAIN 08/01/18   Beverely Pace, DNP NP   EUTHYROX 100 MCG tablet TAKE 1 TABLET BY MOUTH ONCE DAILY AT  6  AM 08/07/18   Beverely Pace, DNP NP   fenofibrate (LOFIBRA) 160 MG tablet Take 1 tablet (160 mg total) by mouth daily 07/15/18   Beverely Pace, DNP NP   Glucos-Chond-Hyal Ac-Ca Fructo (MOVE FREE JOINT HEALTH ADVANCE PO) Take by mouth    [provider]   ibandronate (BONIVA) 150 MG tablet TAKE 1 TABLET BY MOUTH ONCE EVERY 30 DAYS 07/15/18   Beverely Pace, DNP NP   losartan-hydrochlorothiazide [employer]) 50-12.5 MG per tablet TAKE 1 TABLET EVERY DAY 12/19/17   Graciela Husbands, DO   lovastatin (MEVACOR) 20 MG tablet TAKE ONE TABLET ONCE DAILY FOR CHOLESTEROL 12/19/17   Graciela Husbands, DO   Multiple Vitamin (MULTIVITAMIN) tablet Take 1 tablet by mouth daily.    [provider]   Multiple Vitamins-Minerals (HAIR/SKIN/NAILS/BIOTIN PO) Take 1 tablet by mouth daily.    [provider]   pantoprazole (PROTONIX) 40 MG tablet TAKE 1 TABLET EVERY DAY 12/19/17   Graciela Husbands, DO   Polyethylene Glycol 3350 (MIRALAX PO) Take by mouth daily.    [provider]   potassium chloride (K-DUR,KLOR-CON) 20 MEQ tablet TAKE 1 TABLET EVERY DAY 12/19/17   Graciela Husbands, DO   Turmeric 500 MG Tab Take 1 tablet by mouth daily.    [provider]   venlafaxine (EFFEXOR-XR) 75 MG 24 hr capsule TAKE 1 CAPSULE EVERY DAY 12/19/17   Graciela Husbands, DO        Review of Systems:   Review of Systems   Constitutional: Positive for weight loss (Intentional. She is controlling her portion sizes closely). Negative for chills, diaphoresis, fever and malaise/fatigue.   HENT: Negative for congestion, ear discharge, ear pain, hearing loss, sinus pain and sore throat.    Eyes: Negative for discharge and redness.   Respiratory: Negative for cough, sputum production, shortness of breath, wheezing and stridor.    Cardiovascular: Negative for chest pain, palpitations, claudication and leg swelling.   Gastrointestinal: Negative for abdominal pain, blood in stool, constipation, diarrhea, heartburn, melena, nausea and vomiting.   Genitourinary: Negative for dysuria, frequency, hematuria and urgency.   Musculoskeletal: Positive for back pain. Negative for falls, myalgias and neck pain.   Skin: Negative for itching and rash.   Neurological: Negative for dizziness, speech change, weakness and headaches.   Psychiatric/Behavioral: Negative for depression, memory loss and suicidal ideas. The patient  has insomnia. The patient is not nervous/anxious.        Physical Exam:      Vitals:    09/24/18 1259   BP: 115/61   BP Site: Left arm   Patient Position: Sitting   Cuff Size: Large   Pulse: 75   Resp: 16   Temp: 98.3 F (36.8 C)   TempSrc: Tympanic   SpO2: 97%   Weight: 68.9 kg (152 lb)   Height: 1.626 m (5' 4.02")       Physical Exam  Constitutional:       Appearance: She is well-developed.   HENT:      Head: Normocephalic.   Eyes:      Pupils: Pupils are equal, round, and reactive to light.   Neck:      Musculoskeletal: Neck supple.      Vascular: No carotid bruit.   Cardiovascular:      Rate and Rhythm: Normal rate and regular rhythm.      Heart sounds: S1 normal and S2 normal. No murmur. No friction rub. No gallop.    Pulmonary:      Effort: Pulmonary effort is normal.      Breath sounds: Normal breath sounds.   Abdominal:      Palpations: Abdomen is soft.   Skin:     General: Skin is warm and dry.   Neurological:      Mental Status: She is alert and oriented to person, place, and time.          Assessment:      Encounter Diagnoses   Name Primary?    Essential hypertension     Mixed hyperlipidemia     Hypothyroidism, unspecified type     Other insomnia           Plan:     Orders Placed This Encounter   Procedures    CBC and differential     Standing Status:   Future     Standing Expiration Date:   11/24/2018    Comprehensive metabolic panel     Standing Status:   Future     Standing Expiration Date:   11/24/2018     Order Specific Question:   Has the patient fasted?     Answer:   Yes    Lipid panel     Standing Status:   Future     Standing Expiration Date:   11/24/2018     Order Specific Question:   Has the patient fasted?     Answer:   Yes    T3, free     Standing Status:   Future     Standing Expiration Date:   11/24/2018    TSH, Abn Reflex to Free T4, Serum     Standing Status:   Future     Standing Expiration Date:   11/24/2018       Requested Prescriptions     Signed Prescriptions Disp Refills     clonazePAM (KlonoPIN) 1 MG tablet 135 tablet 1     Sig: 1 and 1/2 tablets PO HS     Advised Ms. Coral to use bug repellent when she is going outdoors. She should also be using sunscreen any time she plans to be outdoors for more than 10 minutes at a time.     Cassandra [name] will bring her advance directive to her next scheduled appointment to be scanned into her medical record.     Increased Cassandra [name]'s Clonazepam to 1.5 mg QHS. She will need to take 1 and 1/2 tablet to make this dosage.     Patient will complete fasting labs today following the visit. Results will be communicated to the patient as they become available.     Continue all medications as prescribed.     Return in about 4 months (around 01/24/2019).    By signing my name below, I, Dorise Hiss, attest that this documentation has been prepared under the direction and in the presence of Beverely Pace, DNP.  Electronically Signed: Dorise Hiss  Scribe. 09/24/2018 1:51 PM     I, Beverely Pace, DNP , personally performed the services described in this documentation. All medical record entries made by the scribe were at my direction and in my presence. I have reviewed the chart and plan instructions and agree that the record reflects my personal performance and is accurate and complete.   09/24/2018 1:51 PM

## 2018-09-26 ENCOUNTER — Other Ambulatory Visit (RURAL_HEALTH_CENTER): Payer: Self-pay | Admitting: Family

## 2018-09-26 ENCOUNTER — Telehealth (RURAL_HEALTH_CENTER): Payer: Self-pay | Admitting: Family

## 2018-09-26 DIAGNOSIS — R7989 Other specified abnormal findings of blood chemistry: Secondary | ICD-10-CM

## 2018-09-26 NOTE — Telephone Encounter (Signed)
Attempted to call patient. LM for return call.

## 2018-09-26 NOTE — Telephone Encounter (Signed)
Pt denies taking any aleve, advil or ibuprofen. She does take the diclofenac tablets that have been prescribed. Should she stop utilizing these as well? Recheck blood in 1 month?

## 2018-09-26 NOTE — Telephone Encounter (Signed)
Hold Diclofenac and recheck labs in 1 month.

## 2018-09-26 NOTE — Telephone Encounter (Signed)
-----   Message from Beverely Pace, [place] NP sent at 09/26/2018  1:14 PM EDT -----  Labs OK except creatinine which is slightly abnormal. Does she take Aleve Advil or other NSAIDs? If so stop all and we will recheck lab in 1 month.

## 2018-09-27 NOTE — Telephone Encounter (Signed)
Lm to return call.

## 2018-09-30 NOTE — Telephone Encounter (Signed)
Left a message for the patient to call the office. HC LPN

## 2018-10-01 NOTE — Telephone Encounter (Signed)
Patients husband is on emergency contact list and is aware of the message below and will make sure to tell patient.

## 2018-10-24 ENCOUNTER — Other Ambulatory Visit (RURAL_HEALTH_CENTER): Payer: Self-pay | Admitting: Family

## 2018-10-25 ENCOUNTER — Other Ambulatory Visit (RURAL_HEALTH_CENTER): Payer: Self-pay | Admitting: Family

## 2018-10-25 MED ORDER — LEVOTHYROXINE SODIUM 100 MCG PO TABS
100.0000 ug | ORAL_TABLET | Freq: Every day | ORAL | 1 refills | Status: DC
Start: 2018-10-25 — End: 2018-11-25

## 2018-11-25 ENCOUNTER — Other Ambulatory Visit (RURAL_HEALTH_CENTER): Payer: Self-pay | Admitting: Family

## 2018-11-25 MED ORDER — LEVOTHYROXINE SODIUM 100 MCG PO TABS
100.00 ug | ORAL_TABLET | Freq: Every day | ORAL | 0 refills | Status: AC
Start: 2018-11-25 — End: ?

## 2018-11-25 MED ORDER — LOVASTATIN 20 MG PO TABS
ORAL_TABLET | ORAL | 0 refills | Status: DC
Start: 2018-11-25 — End: 2019-02-26

## 2018-11-25 NOTE — Telephone Encounter (Signed)
Patient would like her pharmacy changed to [facility] instead of [facility].  She also will need two Rx's refilled:    1)  Lovastatin for 90 days.    2)  Levothyroxine for 90 days.

## 2019-01-03 ENCOUNTER — Other Ambulatory Visit (RURAL_HEALTH_CENTER): Payer: Self-pay | Admitting: Family

## 2019-01-15 ENCOUNTER — Other Ambulatory Visit (RURAL_HEALTH_CENTER): Payer: Self-pay | Admitting: Family

## 2019-01-15 NOTE — Telephone Encounter (Signed)
Refill of pantoprazole 40 mg to [facility] in 90 day supply.

## 2019-01-16 MED ORDER — PANTOPRAZOLE SODIUM 40 MG PO TBEC
40.00 mg | DELAYED_RELEASE_TABLET | Freq: Every day | ORAL | 3 refills | Status: AC
Start: 2019-01-16 — End: ?

## 2019-01-20 ENCOUNTER — Telehealth (RURAL_HEALTH_CENTER): Payer: Self-pay | Admitting: Family

## 2019-01-20 NOTE — Telephone Encounter (Signed)
Pt made aware this was filled at [facility] on 01/16/2019

## 2019-01-20 NOTE — Telephone Encounter (Signed)
Refill of pantoprazole to [facility]

## 2019-01-21 ENCOUNTER — Telehealth (RURAL_HEALTH_CENTER): Payer: Self-pay

## 2019-01-21 DIAGNOSIS — E782 Mixed hyperlipidemia: Secondary | ICD-10-CM

## 2019-01-21 DIAGNOSIS — E039 Hypothyroidism, unspecified: Secondary | ICD-10-CM

## 2019-01-21 DIAGNOSIS — G2581 Restless legs syndrome: Secondary | ICD-10-CM

## 2019-01-21 DIAGNOSIS — I10 Essential (primary) hypertension: Secondary | ICD-10-CM

## 2019-01-21 NOTE — Telephone Encounter (Signed)
Patient wanting pre-visit labs done - all have been ordered and pended to you. Thank you!

## 2019-01-22 ENCOUNTER — Other Ambulatory Visit
Admission: RE | Admit: 2019-01-22 | Discharge: 2019-01-22 | Disposition: A | Discharge status: Home / Self Care | Payer: Medicare Other | Source: Ambulatory Visit | Attending: Family | Admitting: Family

## 2019-01-22 DIAGNOSIS — G2581 Restless legs syndrome: Secondary | ICD-10-CM

## 2019-01-22 DIAGNOSIS — E782 Mixed hyperlipidemia: Secondary | ICD-10-CM

## 2019-01-22 DIAGNOSIS — I10 Essential (primary) hypertension: Secondary | ICD-10-CM

## 2019-01-22 DIAGNOSIS — E039 Hypothyroidism, unspecified: Secondary | ICD-10-CM

## 2019-01-22 LAB — COMPREHENSIVE METABOLIC PANEL
ALT: 17 U/L (ref 0–55)
AST (SGOT): 23 U/L (ref 10–42)
Albumin/Globulin Ratio: 1.33 Ratio (ref 0.80–2.00)
Albumin: 4 gm/dL (ref 3.5–5.0)
Alkaline Phosphatase: 36 U/L — ABNORMAL LOW (ref 40–145)
Anion Gap: 13.9 mMol/L (ref 7.0–18.0)
BUN / Creatinine Ratio: 22.9 Ratio (ref 10.0–30.0)
BUN: 25 mg/dL — ABNORMAL HIGH (ref 7–22)
Bilirubin, Total: 0.3 mg/dL (ref 0.1–1.2)
CO2: 27 mMol/L (ref 20–30)
Calcium: 8.6 mg/dL (ref 8.5–10.5)
Chloride: 106 mMol/L (ref 98–110)
Creatinine: 1.09 mg/dL (ref 0.60–1.20)
EGFR: 48 mL/min/{1.73_m2} — ABNORMAL LOW (ref 60–150)
Globulin: 3 gm/dL (ref 2.0–4.0)
Glucose: 89 mg/dL (ref 71–99)
Osmolality Calculated: 289 mOsm/kg (ref 275–300)
Potassium: 3.9 mMol/L (ref 3.5–5.3)
Protein, Total: 7 gm/dL (ref 6.0–8.3)
Sodium: 143 mMol/L (ref 136–147)

## 2019-01-22 LAB — CBC AND DIFFERENTIAL
Basophils %: 0.4 % (ref 0.0–3.0)
Basophils Absolute: 0 10*3/uL (ref 0.0–0.3)
Eosinophils %: 4.8 % (ref 0.0–7.0)
Eosinophils Absolute: 0.3 10*3/uL (ref 0.0–0.8)
Hematocrit: 37.8 % (ref 36.0–48.0)
Hemoglobin: 12.2 gm/dL (ref 12.0–16.0)
Lymphocytes Absolute: 1.7 10*3/uL (ref 0.6–5.1)
Lymphocytes: 24.9 % (ref 15.0–46.0)
MCH: 31 pg (ref 28–35)
MCHC: 32 gm/dL (ref 32–36)
MCV: 95 fL (ref 80–100)
MPV: 8.4 fL (ref 6.0–10.0)
Monocytes Absolute: 0.7 10*3/uL (ref 0.1–1.7)
Monocytes: 10.2 % (ref 3.0–15.0)
Neutrophils %: 59.8 % (ref 42.0–78.0)
Neutrophils Absolute: 4 10*3/uL (ref 1.7–8.6)
PLT CT: 310 10*3/uL (ref 130–440)
RBC: 3.98 10*6/uL (ref 3.80–5.00)
RDW: 12.1 % (ref 11.0–14.0)
WBC: 6.6 10*3/uL (ref 4.0–11.0)

## 2019-01-22 LAB — THYROID STIMULATING HORMONE (TSH), REFLEX ON ABNORMAL TO FREE T4, SERUM: TSH: 2.67 u[IU]/mL (ref 0.40–4.20)

## 2019-01-22 LAB — LIPID PANEL
Cholesterol: 139 mg/dL (ref 75–199)
Coronary Heart Disease Risk: 4.48
HDL: 31 mg/dL — ABNORMAL LOW (ref 45–65)
LDL Calculated: 87 mg/dL
Triglycerides: 104 mg/dL (ref 10–150)
VLDL: 21 (ref 0–40)

## 2019-01-22 LAB — T3, FREE: T3, Free: 1.9 pg/mL (ref 1.71–3.71)

## 2019-01-27 ENCOUNTER — Encounter (RURAL_HEALTH_CENTER): Payer: Self-pay | Admitting: Family

## 2019-01-27 ENCOUNTER — Ambulatory Visit: Payer: Medicare Other | Attending: Family | Admitting: Family

## 2019-01-27 ENCOUNTER — Telehealth (RURAL_HEALTH_CENTER): Payer: Self-pay | Admitting: Family

## 2019-01-27 VITALS — BP 96/68 | HR 98 | Temp 98.1°F | Resp 18 | Ht 64.02 in | Wt 155.0 lb

## 2019-01-27 DIAGNOSIS — E782 Mixed hyperlipidemia: Secondary | ICD-10-CM

## 2019-01-27 DIAGNOSIS — B351 Tinea unguium: Secondary | ICD-10-CM

## 2019-01-27 DIAGNOSIS — G4709 Other insomnia: Secondary | ICD-10-CM

## 2019-01-27 DIAGNOSIS — G2581 Restless legs syndrome: Secondary | ICD-10-CM

## 2019-01-27 DIAGNOSIS — E039 Hypothyroidism, unspecified: Secondary | ICD-10-CM

## 2019-01-27 DIAGNOSIS — I10 Essential (primary) hypertension: Secondary | ICD-10-CM

## 2019-01-27 NOTE — Progress Notes (Signed)
Patient Name: Cassandra [name],Cassandra [name]    Subjective   History of Present Illness:   Cassandra [name] is a 83 y.o. female who presents with:  Chief Complaint   Patient presents with    Hypertension    Fall     Pt reports several falls      Hypertension:  The patient is currently taking Hyzaar 50-12.5MG  QD for HTN control. BP in the office today is 96/68. She denies chest pain, lower extremity edema or palpitations.    Hyperlipidemia:  She is currently taking Lovastatin 20MG  QD for hyperlipidemia. Last labs showed total cholesterol of 139 with a HDL of 31, LDL of 87, and Triglycerides of 104 on 01/22/2019.     Hypothyroidism:   The patient is currently taking levothyroxine 100 mcg daily. The patient denies symptoms of hypothyroidism including fatigue, cold intolerance, dry skin, scalp hair thinning, constipation, depression or weight gain.    Falls:  Cassandra [name] reports several falls this year, the most recent being 2 weeks ago.    Toe Concerns:  Cassandra [name]'s toenails on both feet are splitting.    Insomnia:  Cassandra [name] reports she is sleeping, and no longer needs to take "extra stuff." to help her sleep.     Health Management:  Cassandra [name] received her flu shot for the upcoming flu season.    The following portions of the patient's history were reviewed and updated as appropriate: allergies, current medications, past family history, past medical history, past social history, past surgical history and problem list.       Problem List:     Patient Active Problem List   Diagnosis    Hyperlipidemia    Hypothyroidism    Osteoarthritis of shoulder    Essential hypertension    Onychomycosis of toenail    RLS (restless legs syndrome)    Symptomatic lesion of skin    Leg pain, lateral, left          Medications:     Prior to Admission medications    Medication Sig Start Date End Date Taking? Authorizing Provider   Ascorbic Acid (VITA-C PO) Take 180 mg by mouth 2 (two) times daily        [provider]    Cetirizine HCl (KLS ALLER-TEC PO) Take by mouth    [provider]   clonazePAM (KlonoPIN) 1 MG tablet 1 and 1/2 tablets PO HS 09/24/18   Beverely Pace, DNP NP   Coenzyme Q10 (CO Q 10 PO) Take 200 mg by mouth daily.        [provider]   CRANBERRY CONCENTRATE PO Take 4,200 mg by mouth daily.    [provider]   diclofenac (VOLTAREN) 75 MG EC tablet TAKE 1 TABLET TWICE DAILY AS NEEDED FOR PAIN 08/01/18   Beverely Pace, DNP NP   fenofibrate (LOFIBRA) 160 MG tablet Take 1 tablet (160 mg total) by mouth daily 07/15/18   Beverely Pace, DNP NP   Glucos-Chond-Hyal Ac-Ca Fructo (MOVE FREE JOINT HEALTH ADVANCE PO) Take by mouth    [provider]   ibandronate (BONIVA) 150 MG tablet TAKE 1 TABLET BY MOUTH ONCE EVERY 30 DAYS 01/03/19   Beverely Pace, DNP NP   levothyroxine (Euthyrox) 100 MCG tablet Take 1 tablet (100 mcg total) by mouth Once a day at 6:00am 11/25/18   Graciela Husbands, DO   losartan-hydrochlorothiazide [facility]) 50-12.5 MG per tablet TAKE 1 TABLET EVERY DAY 12/19/17   Graciela Husbands, DO   lovastatin (  MEVACOR) 20 MG tablet TAKE ONE TABLET ONCE DAILY FOR CHOLESTEROL 11/25/18   Graciela Husbands, DO   Multiple Vitamin (MULTIVITAMIN) tablet Take 1 tablet by mouth daily.    [provider]   Multiple Vitamins-Minerals (HAIR/SKIN/NAILS/BIOTIN PO) Take 1 tablet by mouth daily.    [provider]   pantoprazole (PROTONIX) 40 MG tablet Take 1 tablet (40 mg total) by mouth daily 01/16/19   Beverely Pace, DNP NP   Polyethylene Glycol 3350 (MIRALAX PO) Take by mouth daily.    [provider]   potassium chloride (K-DUR,KLOR-CON) 20 MEQ tablet TAKE 1 TABLET EVERY DAY 12/19/17   Graciela Husbands, DO   Turmeric 500 MG Tab Take 1 tablet by mouth daily.    [provider]   venlafaxine (EFFEXOR-XR) 75 MG 24 hr capsule TAKE 1 CAPSULE EVERY DAY 12/19/17   Graciela Husbands, DO        Review of Systems:   Review of Systems   Constitutional: Negative for chills, diaphoresis and  fever.   HENT: Negative for congestion, ear discharge, ear pain, hearing loss and sore throat.    Eyes: Negative for blurred vision, double vision, pain, discharge and redness.   Respiratory: Negative for cough, sputum production and shortness of breath.    Cardiovascular: Negative for chest pain, palpitations and leg swelling.   Gastrointestinal: Negative for abdominal pain, constipation, diarrhea, heartburn, nausea and vomiting.   Genitourinary: Negative for dysuria, frequency and urgency.   Musculoskeletal: Negative for falls and myalgias.   Skin: Negative for itching and rash.        Toe nail splitting on both feet   Neurological: Negative for dizziness, tingling, focal weakness, seizures, loss of consciousness and headaches.        Several falls this year   Endo/Heme/Allergies: Does not bruise/bleed easily.   Psychiatric/Behavioral: Negative for hallucinations, memory loss and suicidal ideas.       Physical Exam:      Vitals:    01/27/19 1255   BP: 96/68   BP Site: Left arm   Patient Position: Sitting   Cuff Size: Medium   Pulse: 98   Resp: 18   Temp: 98.1 F (36.7 C)   TempSrc: Temporal   SpO2: 94%   Weight: 70.3 kg (155 lb)   Height: 1.626 m (5' 4.02")       Physical Exam  Vitals signs reviewed.   Constitutional:       Appearance: Normal appearance. She is not toxic-appearing or diaphoretic.   HENT:      Head: Normocephalic and atraumatic.   Eyes:      Extraocular Movements: Extraocular movements intact.      Pupils: Pupils are equal, round, and reactive to light.   Neck:      Musculoskeletal: Normal range of motion.      Vascular: No carotid bruit.   Cardiovascular:      Rate and Rhythm: Normal rate and regular rhythm.      Pulses: Normal pulses.      Heart sounds: Normal heart sounds, S1 normal and S2 normal. No murmur. No friction rub. No gallop.    Pulmonary:      Effort: Pulmonary effort is normal. No respiratory distress.   Abdominal:      Palpations: Abdomen is soft.   Skin:     General: Skin is warm  and dry.   Neurological:      General: No focal deficit present.      Mental Status: She  is alert.   Psychiatric:         Thought Content: Thought content normal.      Comments: PSQ2 score of 0            Assessment:      Encounter Diagnoses   Name Primary?    Onychomycosis Yes    Mixed hyperlipidemia     Hypothyroidism, unspecified type     Essential hypertension     Onychomycosis of toenail     Other insomnia     RLS (restless legs syndrome)           Plan:     Orders Placed This Encounter   Procedures    Ambulatory referral to Podiatry     Referral Priority:   Routine     Referral Type:   Consultation     Referral Reason:   Specialty Services Required     Requested Specialty:   Podiatric Surgery     Number of Visits Requested:   1       Requested Prescriptions      No prescriptions requested or ordered in this encounter     A referral will be made for Ms. Troeger to visit Dr. Blanchard Kelch, a podiatrist in [place].    Ms. Brumleve received her flu shot for the upcoming flu season.    Continue all medications as prescribed.     Return in about 4 months (around 05/27/2019) for routine medical management.    By signing my name below, I, Dareen Piano, attest that this documentation has been prepared under the direction and in the presence of Beverely Pace, DNP.  Electronically Signed: Dareen Piano Scribe. 01/27/2019 1:27 PM     I, Beverely Pace, DNP , personally performed the services described in this documentation. All medical record entries made by the scribe were at my direction and in my presence. I have reviewed the chart and plan instructions and agree that the record reflects my personal performance and is accurate and complete.   01/27/2019 1:27 PM

## 2019-01-27 NOTE — Telephone Encounter (Signed)
-----   Message from Beverely Pace, [place] NP sent at 01/25/2019 11:00 AM EDT -----  Labs are stable.

## 2019-01-27 NOTE — Telephone Encounter (Signed)
Pt's husband aware...

## 2019-02-25 ENCOUNTER — Other Ambulatory Visit (RURAL_HEALTH_CENTER): Payer: Self-pay | Admitting: Internal Medicine

## 2019-03-14 ENCOUNTER — Other Ambulatory Visit (RURAL_HEALTH_CENTER): Payer: Self-pay | Admitting: Family

## 2019-03-14 MED ORDER — POTASSIUM CHLORIDE CRYS ER 20 MEQ PO TBCR
20.00 meq | EXTENDED_RELEASE_TABLET | Freq: Every day | ORAL | 3 refills | Status: AC
Start: 2019-03-14 — End: ?

## 2019-03-14 NOTE — Telephone Encounter (Signed)
[facility] faxed fir refill on potassium , refill pended

## 2019-04-02 ENCOUNTER — Other Ambulatory Visit (RURAL_HEALTH_CENTER): Payer: Self-pay | Admitting: Family

## 2019-04-02 NOTE — Telephone Encounter (Signed)
Patient refill request -

## 2019-04-02 NOTE — Telephone Encounter (Signed)
Call from [facility] [place]  requesting refill of losartan hctz 50/12.5mg .

## 2019-04-03 MED ORDER — LOSARTAN POTASSIUM-HCTZ 50-12.5 MG PO TABS
1.00 | ORAL_TABLET | Freq: Every day | ORAL | 3 refills | Status: AC
Start: 2019-04-03 — End: ?

## 2019-04-13 ENCOUNTER — Other Ambulatory Visit (RURAL_HEALTH_CENTER): Payer: Self-pay | Admitting: Family

## 2019-05-20 ENCOUNTER — Other Ambulatory Visit (RURAL_HEALTH_CENTER): Payer: Self-pay | Admitting: Internal Medicine

## 2019-05-27 ENCOUNTER — Ambulatory Visit: Payer: Medicare Other | Attending: Family | Admitting: Family

## 2019-05-27 ENCOUNTER — Encounter (RURAL_HEALTH_CENTER): Payer: Self-pay | Admitting: Family

## 2019-05-27 VITALS — BP 104/60 | HR 110 | Temp 98.3°F | Resp 16 | Ht 64.02 in | Wt 153.0 lb

## 2019-05-27 DIAGNOSIS — M8589 Other specified disorders of bone density and structure, multiple sites: Secondary | ICD-10-CM

## 2019-05-27 DIAGNOSIS — E039 Hypothyroidism, unspecified: Secondary | ICD-10-CM

## 2019-05-27 DIAGNOSIS — G2581 Restless legs syndrome: Secondary | ICD-10-CM

## 2019-05-27 DIAGNOSIS — E782 Mixed hyperlipidemia: Secondary | ICD-10-CM

## 2019-05-27 DIAGNOSIS — I10 Essential (primary) hypertension: Secondary | ICD-10-CM

## 2019-05-27 NOTE — Patient Instructions (Addendum)
If possible, please arrive fasting to your next appointment so we can obtain fasting labs. You may have black coffee, unsweetened tea, or plain water before completing fasting labs.     Orders placed for a Bone DEXA-scan of the axial skeleton. Patient will be contacted to schedule this scan. Results will be communicated to the patient as they become available.     Simple Ways to Avoid COVID-19  The COVID-19 pandemic is sweeping the world. We all want to avoid this dangerous virus. Let's look at some simple ways to lower your risk of infection.  Wash your hands!  First, we know that washing hands is one of the best ways to keep this virus from spreading. So wash your hands with soap and water, and wash them often.  Lather for at least 20 seconds. Always wash your hands after you go to the bathroom. Wash them after you blow your nose. Wash them after sneezing or coughing. Wash them before you prepare food. And wash them before you eat. If you don't have a place to wash your hands, hand sanitizer is an option. Use one that contains at least 60 percent alcohol.  Cover coughs and sneezes!  Have the people around you cover their coughs and sneezes. Make sure they cough or sneeze into a tissue, and then throw it in the trash. If they don't have a tissue, they should cough or sneeze into their elbow.  Don't touch your face!  Next, try to avoid touching your face. Why? Because when you touch your face, you can move the virus from your hands to your eyes, nose or mouth. These are all routes the virus uses to get into your body. So even though it's not easy, try to keep your hands away from your face.  Avoid others, as much as possible!  And finally, stay away from others. Avoid groups of people. When you need to be in public, keep your distance from others. And if you do feel sick, stay home. We know the virus spreads from person to person, so staying away from others keeps [place] all safer.    Congratulations of getting both of  your vaccines against COVID-19!

## 2019-05-27 NOTE — Progress Notes (Signed)
Patient Name: [name],[name]    Subjective   History of Present Illness:   Cassandra [name] is a 84 y.o. female who presents with:  Chief Complaint   Patient presents with    Hypertension       Hypertension:  The patient is currently taking Losartan-Hydrochlorothiazide 50-12.5 mg QD  for HTN control. BP in the office today is 104/60. She denies chest pain, lower extremity edema or palpitations.    Hyperlipidemia:   She is currently taking Lovastatin  20 mg QD and Lofibra 160 mg QD  for hyperlipidemia. Last labs showed total cholesterol of 139, Triglycerides of 104, with a HDL of 31, and LDL of 87 on 01/22/2019.     Hypothyroidism:   The patient is currently taking 100 mcg daily. The patient denies symptoms of hypothyroidism including fatigue, cold intolerance, dry skin, scalp hair thinning, constipation, depression or weight gain.      Restless Leg Syndrome:  She also is having pain in her left leg she describes as "burning" that she thinks may be nerve pain.    Health Maintenance:  Cassandra [name] has had both doses of the Pfizer vaccine against COVID-19.  She is due for a bone DEXA-scan. Her last scan was in 2017.    She also is having pain in her left leg she describes as "burning" that she thinks may be nerve pain.  The following portions of the patient's history were reviewed and updated as appropriate: allergies, current medications, past family history, past medical history, past social history, past surgical history and problem list.       Problem List:     Patient Active Problem List   Diagnosis    Hyperlipidemia    Hypothyroidism    Osteoarthritis of shoulder    Essential hypertension    Onychomycosis of toenail    RLS (restless legs syndrome)    Symptomatic lesion of skin        Medications:     Prior to Admission medications    Medication Sig Start Date End Date Taking? Authorizing Provider   Ascorbic Acid (VITA-C PO) Take 180 mg by mouth 2 (two) times daily        [provider]    Cetirizine HCl (KLS ALLER-TEC PO) Take by mouth    [provider]   clonazePAM (KlonoPIN) 1 MG tablet TAKE 1 & 1/2 (ONE & ONE-HALF) TABLETS BY MOUTH AT BEDTIME 04/14/19   Beverely Pace, DNP NP   Coenzyme Q10 (CO Q 10 PO) Take 200 mg by mouth daily.        [provider]   CRANBERRY CONCENTRATE PO Take 4,200 mg by mouth daily.    [provider]   cyanocobalamin 250 MCG tablet Take 250 mcg by mouth daily    [provider]   ELDERBERRY PO Take by mouth    [provider]   fenofibrate (LOFIBRA) 160 MG tablet Take 1 tablet (160 mg total) by mouth daily 07/15/18   Beverely Pace, DNP NP   Glucos-Chond-Hyal Ac-Ca Fructo (MOVE FREE JOINT HEALTH ADVANCE PO) Take by mouth    [provider]   ibandronate (BONIVA) 150 MG tablet TAKE 1 TABLET BY MOUTH ONCE EVERY 30 DAYS 01/03/19   Beverely Pace, DNP NP   levothyroxine (Euthyrox) 100 MCG tablet Take 1 tablet (100 mcg total) by mouth Once a day at 6:00am 11/25/18   Cassandra Husbands, DO   losartan-hydrochlorothiazide (HYZAAR) 50-12.5 MG per tablet Take 1 tablet  by mouth daily 04/03/19   Beverely Pace, DNP NP   lovastatin (MEVACOR) 20 MG tablet TAKE 1 TABLET BY MOUTH ONCE DAILY FOR CHOLESTEROL 05/20/19   Cassandra Husbands, DO   Multiple Vitamin (MULTIVITAMIN) tablet Take 1 tablet by mouth daily.    [provider]   Multiple Vitamins-Minerals (HAIR/SKIN/NAILS/BIOTIN PO) Take 1 tablet by mouth daily.    [provider]   pantoprazole (PROTONIX) 40 MG tablet Take 1 tablet (40 mg total) by mouth daily 01/16/19   Beverely Pace, DNP NP   Polyethylene Glycol 3350 (MIRALAX PO) Take by mouth daily.    [provider]   potassium chloride (KLOR-CON) 20 MEQ tablet Take 1 tablet (20 mEq total) by mouth daily 03/14/19   Beverely Pace, DNP NP   Probiotic Product (PROBIOTIC ADVANCED PO) Take by mouth    [provider]   Turmeric 500 MG Tab Take 1 tablet by mouth daily.    [provider]   UNABLE  TO FIND Focus factor    [provider]   venlafaxine (EFFEXOR-XR) 75 MG 24 hr capsule TAKE 1 CAPSULE EVERY DAY 12/19/17   Cassandra Husbands, DO        Review of Systems:   Review of Systems   Constitutional: Negative for chills, diaphoresis, fever and weight loss.   HENT: Negative for ear discharge, ear pain, hearing loss, nosebleeds, sinus pain and sore throat.    Eyes: Negative for blurred vision, double vision, pain, discharge and redness.   Respiratory: Negative for cough, sputum production and shortness of breath.    Cardiovascular: Negative for chest pain, palpitations and leg swelling.   Gastrointestinal: Negative for abdominal pain, blood in stool, constipation, diarrhea, heartburn, melena, nausea and vomiting.   Genitourinary: Negative for dysuria, frequency, hematuria and urgency.   Musculoskeletal: Negative for falls and myalgias.   Skin: Negative for itching and rash.   Neurological: Negative for dizziness, tingling, tremors, sensory change, speech change, focal weakness and loss of consciousness.   Endo/Heme/Allergies: Does not bruise/bleed easily.   Psychiatric/Behavioral: Negative for depression, hallucinations, substance abuse and suicidal ideas. The patient is not nervous/anxious.        Physical Exam:      Vitals:    05/27/19 1257   BP: 104/60   BP Site: Left arm   Patient Position: Sitting   Cuff Size: Medium   Pulse: (!) 110   Resp: 16   Temp: 98.3 F (36.8 C)   TempSrc: Temporal   SpO2: 95%   Weight: 69.4 kg (153 lb)   Height: 1.626 m (5' 4.02")       Physical Exam  Vitals signs reviewed.   Constitutional:       Appearance: Normal appearance. She is not ill-appearing or diaphoretic.   HENT:      Head: Normocephalic and atraumatic.      Mouth/Throat:      Mouth: Mucous membranes are moist.      Pharynx: Oropharynx is clear.   Eyes:      Conjunctiva/sclera: Conjunctivae normal.      Pupils: Pupils are equal, round, and reactive to light.   Neck:      Musculoskeletal: Normal range of motion.    Cardiovascular:      Rate and Rhythm: Normal rate and regular rhythm.      Pulses: Normal pulses.      Heart sounds: S1 normal and S2 normal. No murmur. No friction rub. No gallop.    Pulmonary:  Effort: Pulmonary effort is normal.      Breath sounds: Normal breath sounds.   Abdominal:      General: Bowel sounds are normal.      Palpations: Abdomen is soft.   Musculoskeletal: Normal range of motion.   Skin:     General: Skin is warm and dry.   Neurological:      General: No focal deficit present.      Mental Status: She is alert and oriented to person, place, and time.   Psychiatric:         Thought Content: Thought content normal.            Assessment:      Encounter Diagnoses   Name Primary?    Mixed hyperlipidemia     Hypothyroidism, unspecified type     Essential hypertension     RLS (restless legs syndrome)     Osteopenia of multiple sites Yes          Plan:     Orders Placed This Encounter   Procedures    Dxa Bone Density Axial Skeleton     Standing Status:   Future     Standing Expiration Date:   05/26/2020     Order Specific Question:   Reason for Exam:     Answer:   osteopenia       Patient Instructions   If possible, please arrive fasting to your next appointment so we can obtain fasting labs. You may have black coffee, unsweetened tea, or plain water before completing fasting labs.     Orders placed for a Bone DEXA-scan of the axial skeleton. Patient will be contacted to schedule this scan. Results will be communicated to the patient as they become available.     Simple Ways to Avoid COVID-19  The COVID-19 pandemic is sweeping the world. We all want to avoid this dangerous virus. Let's look at some simple ways to lower your risk of infection.  Wash your hands!  First, we know that washing hands is one of the best ways to keep this virus from spreading. So wash your hands with soap and water, and wash them often.  Lather for at least 20 seconds. Always wash your hands after you go to the  bathroom. Wash them after you blow your nose. Wash them after sneezing or coughing. Wash them before you prepare food. And wash them before you eat. If you don't have a place to wash your hands, hand sanitizer is an option. Use one that contains at least 60 percent alcohol.  Cover coughs and sneezes!  Have the people around you cover their coughs and sneezes. Make sure they cough or sneeze into a tissue, and then throw it in the trash. If they don't have a tissue, they should cough or sneeze into their elbow.  Don't touch your face!  Next, try to avoid touching your face. Why? Because when you touch your face, you can move the virus from your hands to your eyes, nose or mouth. These are all routes the virus uses to get into your body. So even though it's not easy, try to keep your hands away from your face.  Avoid others, as much as possible!  And finally, stay away from others. Avoid groups of people. When you need to be in public, keep your distance from others. And if you do feel sick, stay home. We know the virus spreads from person to person, so staying away from others keeps  [place] all safer.    Congratulations of getting both of your vaccines against COVID-19!       Continue all medications as prescribed.     Return in about 6 months (around 11/27/2019) for HTN, RSL follow up.    By signing my name below, I, Ashlynn Seal, attest that this documentation has been prepared under the direction and in the presence of Beverely Pace, DNP.  Electronically Signed: Levada Schilling, Scribe. 05/27/2019 3:22 PM     I, Beverely Pace, DNP , personally performed the services described in this documentation. All medical record entries made by the scribe were at my direction and in my presence. I have reviewed the chart and plan instructions and agree that the record reflects my personal performance and is accurate and complete.   05/27/2019 3:22 PM

## 2019-06-10 ENCOUNTER — Other Ambulatory Visit (RURAL_HEALTH_CENTER): Payer: Self-pay | Admitting: Family

## 2019-06-10 NOTE — Telephone Encounter (Signed)
Need a new prescription for her venlafaxine 75mg . [id number]. She  said to also let you know she nervous, and on edge all the time. I did offer to check on getting her in for an appointment and she said to just let you know. She is having problems sleeping with restless legs, which she does have medication for this. She said it is not every day but more often than not.

## 2019-06-10 NOTE — Telephone Encounter (Signed)
See below

## 2019-06-11 MED ORDER — VENLAFAXINE HCL ER 75 MG PO CP24
75.00 mg | ORAL_CAPSULE | Freq: Every day | ORAL | 1 refills | Status: DC
Start: 2019-06-11 — End: 2019-07-14

## 2019-07-10 ENCOUNTER — Telehealth (RURAL_HEALTH_CENTER): Payer: Self-pay | Admitting: Family

## 2019-07-10 NOTE — Telephone Encounter (Signed)
Patient and her husband calling in asking for an apt for patient before they move as she has been having some occasional rib pain under her left breast. Patient denies SOB or heart related pain. Denies feeling her heart race. Denies feeling light headed or dizzy. Denies any arm pain, numbness or tingling. She states that she notices it when she sits down and relaxes. She stated that nothing makes the pain worse or better. I scheduled patient an apt for Monday morning. Patient and her husband were advised that if at any time things worsen or she becomes SOB or any other cardiac related signs they are to seek treatment at the ER for evaluation. They both verbalized understanding.

## 2019-07-14 ENCOUNTER — Ambulatory Visit
Admission: RE | Admit: 2019-07-14 | Discharge: 2019-07-14 | Disposition: A | Discharge status: Home / Self Care | Payer: Medicare Other | Source: Ambulatory Visit | Attending: Family | Admitting: Family

## 2019-07-14 ENCOUNTER — Encounter (RURAL_HEALTH_CENTER): Payer: Self-pay | Admitting: Family

## 2019-07-14 ENCOUNTER — Ambulatory Visit: Payer: Medicare Other | Attending: Family | Admitting: Family

## 2019-07-14 ENCOUNTER — Telehealth (RURAL_HEALTH_CENTER): Payer: Self-pay | Admitting: Family

## 2019-07-14 VITALS — BP 112/68 | HR 106 | Temp 98.7°F | Resp 16 | Ht 64.02 in | Wt 152.0 lb

## 2019-07-14 DIAGNOSIS — R52 Pain, unspecified: Secondary | ICD-10-CM

## 2019-07-14 DIAGNOSIS — R0781 Pleurodynia: Secondary | ICD-10-CM | POA: Insufficient documentation

## 2019-07-14 NOTE — Telephone Encounter (Signed)
Pt aware. Pt verbalized good understanding. No questions or concerns at this time.

## 2019-07-14 NOTE — Patient Instructions (Signed)
Orders placed for X-ray studies of left ribs. Patient will complete these as soon as they are able. Results will be communicated to the patient as they become available.       Simple Ways to Avoid COVID-19  The COVID-19 pandemic is sweeping the world. We all want to avoid this dangerous virus. Let's look at some simple ways to lower your risk of infection.  Wash your hands!  First, we know that washing hands is one of the best ways to keep this virus from spreading. So wash your hands with soap and water, and wash them often.  Lather for at least 20 seconds. Always wash your hands after you go to the bathroom. Wash them after you blow your nose. Wash them after sneezing or coughing. Wash them before you prepare food. And wash them before you eat. If you don't have a place to wash your hands, hand sanitizer is an option. Use one that contains at least 60 percent alcohol.  Cover coughs and sneezes!  Have the people around you cover their coughs and sneezes. Make sure they cough or sneeze into a tissue, and then throw it in the trash. If they don't have a tissue, they should cough or sneeze into their elbow.  Don't touch your face!  Next, try to avoid touching your face. Why? Because when you touch your face, you can move the virus from your hands to your eyes, nose or mouth. These are all routes the virus uses to get into your body. So even though it's not easy, try to keep your hands away from your face.  Avoid others, as much as possible!  And finally, stay away from others. Avoid groups of people. When you need to be in public, keep your distance from others. And if you do feel sick, stay home. We know the virus spreads from person to person, so staying away from others keeps [place] all safer.    Congratulations on receiving your COVID-19 vaccine!

## 2019-07-14 NOTE — Telephone Encounter (Signed)
-----   Message from Beverely Pace,  NP sent at 07/14/2019  4:36 PM EDT -----  Normal ribs; try no bra. Keep appt Monday.

## 2019-07-14 NOTE — Progress Notes (Signed)
Patient Name: Cassandra [name],Cassandra [name]    Subjective   History of Present Illness:   Cassandra [name] is a 84 y.o. female who presents with:  Chief Complaint   Patient presents with    Rib Pain     left sided      [name] complains of LEFT sided rib pain. It started on 07/11/2019. She states that the pain began below her left breast and is sharp. She notices the pain when she sits down for a moments rest. When the pain is present she states that it hurts to take a deep breath. She presses her fingers into her side to decrease the pain. She has not noticed the pain when she is active. She has loosened her bra in an effort to decrease the pain, but this did nothing for her. She has not taken medication or tried any measures to decrease the pain.     She has been moving things and packing for an upcoming move that is upcoming and states that she has been more active than normal. Her husband believes that the pain she is experiencing is due to stress! He is very worried about her stress levels in dealing with their upcoming move. She reports that "all in one week we sold our home, bought a new home, and began packing! It's just too much all at one time!" Her sleep recently has been very poor.       The following portions of the patient's history were reviewed and updated as appropriate: allergies, current medications, past family history, past medical history, past social history, past surgical history and problem list.       Problem List:     Patient Active Problem List   Diagnosis    Hyperlipidemia    Hypothyroidism    Osteoarthritis of shoulder    Essential hypertension    Onychomycosis of toenail    RLS (restless legs syndrome)    Symptomatic lesion of skin        Medications:     Prior to Admission medications    Medication Sig Start Date End Date Taking? Authorizing Provider   Ascorbic Acid (VITA-C PO) Take 180 mg by mouth 2 (two) times daily        [provider]   Cetirizine HCl (KLS ALLER-TEC  PO) Take by mouth    [provider]   clonazePAM (KlonoPIN) 1 MG tablet TAKE 1 & 1/2 (ONE & ONE-HALF) TABLETS BY MOUTH AT BEDTIME 04/14/19   Beverely Pace, DNP NP   Coenzyme Q10 (CO Q 10 PO) Take 200 mg by mouth daily.        [provider]   CRANBERRY CONCENTRATE PO Take 4,200 mg by mouth daily.    [provider]   cyanocobalamin 250 MCG tablet Take 250 mcg by mouth daily    [provider]   ELDERBERRY PO Take by mouth    [provider]   fenofibrate (LOFIBRA) 160 MG tablet Take 1 tablet (160 mg total) by mouth daily 07/15/18   Beverely Pace, DNP NP   Glucos-Chond-Hyal Ac-Ca Fructo (MOVE FREE JOINT HEALTH ADVANCE PO) Take by mouth    [provider]   ibandronate (BONIVA) 150 MG tablet TAKE 1 TABLET BY MOUTH ONCE EVERY 30 DAYS 01/03/19   Beverely Pace, DNP NP   levothyroxine (Euthyrox) 100 MCG tablet Take 1 tablet (100 mcg total) by mouth Once a day at 6:00am 11/25/18   Graciela Husbands, DO  losartan-hydrochlorothiazide (HYZAAR) 50-12.5 MG per tablet Take 1 tablet by mouth daily 04/03/19   Beverely Pace, DNP NP   lovastatin (MEVACOR) 20 MG tablet TAKE 1 TABLET BY MOUTH ONCE DAILY FOR CHOLESTEROL 05/20/19   Graciela Husbands, DO   Multiple Vitamin (MULTIVITAMIN) tablet Take 1 tablet by mouth daily.    [provider]   Multiple Vitamins-Minerals (HAIR/SKIN/NAILS/BIOTIN PO) Take 1 tablet by mouth daily.    [provider]   pantoprazole (PROTONIX) 40 MG tablet Take 1 tablet (40 mg total) by mouth daily 01/16/19   Beverely Pace, DNP NP   Polyethylene Glycol 3350 (MIRALAX PO) Take by mouth daily.    [provider]   potassium chloride (KLOR-CON) 20 MEQ tablet Take 1 tablet (20 mEq total) by mouth daily 03/14/19   Beverely Pace, DNP NP   Probiotic Product (PROBIOTIC ADVANCED PO) Take by mouth    [provider]   Turmeric 500 MG Tab Take 1 tablet by mouth daily.    [provider]   UNABLE TO FIND Focus factor     [provider]   venlafaxine (EFFEXOR-XR) 75 MG 24 hr capsule Take 1 capsule (75 mg total) by mouth daily 06/11/19   Beverely Pace, DNP NP        Review of Systems:   Review of Systems   Constitutional: Negative for chills, diaphoresis, fever, malaise/fatigue and weight loss.   HENT: Negative for congestion, ear discharge, ear pain, hearing loss, sinus pain and sore throat.    Eyes: Negative for discharge and redness.   Respiratory: Negative for cough, sputum production, shortness of breath, wheezing and stridor.    Cardiovascular: Negative for chest pain, palpitations, claudication and leg swelling.   Gastrointestinal: Negative for abdominal pain, blood in stool, constipation, diarrhea, heartburn, melena, nausea and vomiting.   Genitourinary: Negative for dysuria, frequency, hematuria and urgency.   Musculoskeletal: Negative for falls, myalgias and neck pain.        + left-sided "rib" pain   Skin: Negative for itching and rash.   Neurological: Negative for dizziness, speech change, weakness and headaches.   Psychiatric/Behavioral: Negative for depression, memory loss and suicidal ideas. The patient is not nervous/anxious and does not have insomnia.         Increased stress due to an upcoming move       Physical Exam:      Vitals:    07/14/19 1132   BP: 112/68   BP Site: Right arm   Patient Position: Sitting   Cuff Size: Medium   Pulse: (!) 106   Resp: 16   Temp: 98.7 F (37.1 C)   TempSrc: Temporal   SpO2: 94%   Weight: 68.9 kg (152 lb)   Height: 1.626 m (5' 4.02")       Physical Exam  Vitals reviewed.   Constitutional:       Appearance: Normal appearance. She is well-developed. She is not ill-appearing or diaphoretic.   HENT:      Head: Normocephalic and atraumatic.      Mouth/Throat:      Mouth: Mucous membranes are moist.      Pharynx: Oropharynx is clear.   Eyes:      Pupils: Pupils are equal, round, and reactive to light.   Neck:      Vascular: No carotid bruit.   Cardiovascular:      Rate and  Rhythm: Normal rate and regular rhythm.      Heart sounds: S1 normal and S2  normal. No murmur. No friction rub. No gallop.    Pulmonary:      Effort: Pulmonary effort is normal.      Breath sounds: Normal breath sounds.   Abdominal:      General: Bowel sounds are normal.      Palpations: Abdomen is soft.      Tenderness: There is no abdominal tenderness.   Musculoskeletal:      Cervical back: Normal range of motion and neck supple.      Comments: Patient indicates that the pain is more anterior, about the bottom of her bra line.    Skin:     General: Skin is warm and dry.   Neurological:      General: No focal deficit present.      Mental Status: She is alert and oriented to person, place, and time.   Psychiatric:         Thought Content: Thought content normal.            Assessment:      Encounter Diagnosis   Name Primary?    Pain Yes          Plan:     Orders Placed This Encounter   Procedures    XR Ribs left 2 views     Standing Status:   Future     Number of Occurrences:   1     Standing Expiration Date:   07/13/2020     Order Specific Question:   Reason for Exam:     Answer:   pain left ribs     Order Specific Question:   Release to patient     Answer:   Immediate       Requested Prescriptions      No prescriptions requested or ordered in this encounter       Patient Instructions   Orders placed for X-ray studies of left ribs. Patient will complete these as soon as they are able. Results will be communicated to the patient as they become available.       Simple Ways to Avoid COVID-19  The COVID-19 pandemic is sweeping the world. We all want to avoid this dangerous virus. Let's look at some simple ways to lower your risk of infection.  Wash your hands!  First, we know that washing hands is one of the best ways to keep this virus from spreading. So wash your hands with soap and water, and wash them often.  Lather for at least 20 seconds. Always wash your hands after you go to the bathroom. Wash them after you  blow your nose. Wash them after sneezing or coughing. Wash them before you prepare food. And wash them before you eat. If you don't have a place to wash your hands, hand sanitizer is an option. Use one that contains at least 60 percent alcohol.  Cover coughs and sneezes!  Have the people around you cover their coughs and sneezes. Make sure they cough or sneeze into a tissue, and then throw it in the trash. If they don't have a tissue, they should cough or sneeze into their elbow.  Don't touch your face!  Next, try to avoid touching your face. Why? Because when you touch your face, you can move the virus from your hands to your eyes, nose or mouth. These are all routes the virus uses to get into your body. So even though it's not easy, try to keep your hands away from your face.  Avoid others, as much as possible!  And finally, stay away from others. Avoid groups of people. When you need to be in public, keep your distance from others. And if you do feel sick, stay home. We know the virus spreads from person to person, so staying away from others keeps [place] all safer.    Congratulations on receiving your COVID-19 vaccine!        Continue all medications as prescribed.     Return in about 1 week (around 07/21/2019) for stress follow up.    By signing my name below, I, Dorise Hiss, attest that this documentation has been prepared under the direction and in the presence of Beverely Pace, DNP.  Electronically Signed: Dorise Hiss Scribe. 07/14/2019 4:39 PM     I, Beverely Pace, DNP , personally performed the services described in this documentation. All medical record entries made by the scribe were at my direction and in my presence. I have reviewed the chart and plan instructions and agree that the record reflects my personal performance and is accurate and complete.   07/14/2019 4:39 PM

## 2019-07-18 ENCOUNTER — Other Ambulatory Visit (RURAL_HEALTH_CENTER): Payer: Self-pay | Admitting: Family

## 2019-07-21 ENCOUNTER — Encounter (RURAL_HEALTH_CENTER): Payer: Self-pay | Admitting: Family

## 2019-07-21 ENCOUNTER — Ambulatory Visit: Payer: Medicare Other | Attending: Family | Admitting: Family

## 2019-07-21 DIAGNOSIS — R0781 Pleurodynia: Secondary | ICD-10-CM

## 2019-07-21 NOTE — Patient Instructions (Signed)
When you establish with a new primary care provider, please let them know about our office so they can reach out to obtain your past medical records! Our office number is [phone] and our office fax number is [phone]!     If your medications need to be sent to a new pharmacy, please contact my office so we can send your medications to the appropriate pharmacy for refills until you are able to establish with a new PCP.        Simple Ways to Avoid COVID-19  The COVID-19 pandemic is sweeping the world. We all want to avoid this dangerous virus. Let's look at some simple ways to lower your risk of infection.  Wash your hands!  First, we know that washing hands is one of the best ways to keep this virus from spreading. So wash your hands with soap and water, and wash them often.  Lather for at least 20 seconds. Always wash your hands after you go to the bathroom. Wash them after you blow your nose. Wash them after sneezing or coughing. Wash them before you prepare food. And wash them before you eat. If you don't have a place to wash your hands, hand sanitizer is an option. Use one that contains at least 60 percent alcohol.  Cover coughs and sneezes!  Have the people around you cover their coughs and sneezes. Make sure they cough or sneeze into a tissue, and then throw it in the trash. If they don't have a tissue, they should cough or sneeze into their elbow.  Don't touch your face!  Next, try to avoid touching your face. Why? Because when you touch your face, you can move the virus from your hands to your eyes, nose or mouth. These are all routes the virus uses to get into your body. So even though it's not easy, try to keep your hands away from your face.  Avoid others, as much as possible!  And finally, stay away from others. Avoid groups of people. When you need to be in public, keep your distance from others. And if you do feel sick, stay home. We know the virus spreads from person to person, so staying away  from others keeps [place] all safer.      I highly recommend that you receive the COVID-19 vaccine as soon as it becomes available to you!     [facility] is now filling vaccine clinic appointments using the pre-registration list from the [place] [employer]. This will allow [place] to more fairly work from the wait list and will largely eliminate our need to post links.    If you have not yet pre-registered for the COVID-19 vaccine through the [facility], please do so by visiting [email or web address]. Those without internet should call 1-877-Vax-In-[place] [phone]) to pre-register. Pre-registration does NOT mean you have an appointment.    [facility] is now administering vaccines in our area! You can sign up for an appointment at: [email or web address]. Please do not contact your local [facility] directly with questions about the COVID-19 vaccine; individual stores cannot schedule appointments or answer questions about eligibility. Appointments are being booked online only.    [facility] is now administering vaccines in our area! You can check your availability and sign up at: [email or web address]. If you do not have access to Internet you can call 1-800-SHOP[facility] (8591863656) Monday-Friday 8 AM - 10 PM ET, Saturday & Sununday 10:00 AM - 6:30 PM ET. Closed major holidays.    To learn more about  the COVID-19 vaccine, find out if you qualify for a vaccine at this time, or schedule a vaccine visit, please visit [facility] website for vaccination administration at:  [email or web address]    For answers to frequently asked questions, please visit [email or web address]

## 2019-07-21 NOTE — Progress Notes (Signed)
Patient Name: Cassandra [name],Cassandra [name]    Subjective   History of Present Illness:   Cassandra [name] is a 84 y.o. female who presents with:  Chief Complaint   Patient presents with    Pain     [name] presents for a 1 week follow up of rib pain from 07/14/2019. She states that the rib pain has gone away! She has stopped wearing under-wire bras and reports this has made a difference.     She is moving out of her home on Friday 07/25/2019. She and her husband are moving to [place] and plan to establish with a new primary care provider once settled.       The following portions of the patient's history were reviewed and updated as appropriate: allergies, current medications, past family history, past medical history, past social history, past surgical history and problem list.       Problem List:     Patient Active Problem List   Diagnosis    Hyperlipidemia    Hypothyroidism    Osteoarthritis of shoulder    Essential hypertension    Onychomycosis of toenail    RLS (restless legs syndrome)    Symptomatic lesion of skin        Medications:     Prior to Admission medications    Medication Sig Start Date End Date Taking? Authorizing Provider   Ascorbic Acid (VITA-C PO) Take 180 mg by mouth 2 (two) times daily       Yes [provider]   Cetirizine HCl (KLS ALLER-TEC PO) Take by mouth   Yes [provider]   clonazePAM (KlonoPIN) 1 MG tablet TAKE 1 & 1/2 (ONE & ONE-HALF) TABLETS BY MOUTH AT BEDTIME 07/20/19  Yes Beverely Pace, DNP NP   Coenzyme Q10 (CO Q 10 PO) Take 200 mg by mouth daily.       Yes [provider]   CRANBERRY CONCENTRATE PO Take 4,200 mg by mouth daily.   Yes [provider]   cyanocobalamin 250 MCG tablet Take 250 mcg by mouth daily   Yes [provider]   ELDERBERRY PO Take by mouth   Yes [provider]   fenofibrate (LOFIBRA) 160 MG tablet Take 1 tablet by mouth once daily 07/20/19  Yes Beverely Pace, DNP NP   Glucos-Chond-Hyal  Ac-Ca Fructo (MOVE FREE JOINT HEALTH ADVANCE PO) Take by mouth   Yes [provider]   ibandronate (BONIVA) 150 MG tablet TAKE 1 TABLET BY MOUTH ONCE EVERY 30 DAYS 01/03/19  Yes Beverely Pace, DNP NP   levothyroxine (Euthyrox) 100 MCG tablet Take 1 tablet (100 mcg total) by mouth Once a day at 6:00am 11/25/18  Yes Graciela Husbands, DO   losartan-hydrochlorothiazide [employer]) 50-12.5 MG per tablet Take 1 tablet by mouth daily 04/03/19  Yes Beverely Pace, DNP NP   lovastatin (MEVACOR) 20 MG tablet TAKE 1 TABLET BY MOUTH ONCE DAILY FOR CHOLESTEROL 05/20/19  Yes Graciela Husbands, DO   Multiple Vitamin (MULTIVITAMIN) tablet Take 1 tablet by mouth daily.   Yes [provider]   Multiple Vitamins-Minerals (HAIR/SKIN/NAILS/BIOTIN PO) Take 1 tablet by mouth daily.   Yes [provider]   pantoprazole (PROTONIX) 40 MG tablet Take 1 tablet (40 mg total) by mouth daily 01/16/19  Yes Beverely Pace, DNP NP   Polyethylene Glycol 3350 (MIRALAX PO) Take by mouth daily.   Yes [provider]   potassium chloride (KLOR-CON) 20 MEQ tablet Take 1 tablet (20 mEq total)  by mouth daily 03/14/19  Yes Beverely Pace, DNP NP   Probiotic Product (PROBIOTIC ADVANCED PO) Take by mouth   Yes [provider]   Turmeric 500 MG Tab Take 1 tablet by mouth daily.   Yes [provider]   UNABLE TO FIND Focus factor   Yes [provider]   venlafaxine (EFFEXOR) 75 MG tablet Take 75 mg by mouth 2 (two) times daily   Yes [provider]        Review of Systems:   Review of Systems   Constitutional: Negative for chills, diaphoresis, fever, malaise/fatigue and weight loss.   HENT: Negative for congestion, ear discharge, ear pain, hearing loss, sinus pain and sore throat.    Eyes: Negative for discharge and redness.   Respiratory: Negative for cough, sputum production, shortness of breath, wheezing and stridor.    Cardiovascular: Negative for chest pain, palpitations, claudication and leg  swelling.   Gastrointestinal: Negative for abdominal pain, blood in stool, constipation, diarrhea, heartburn, melena, nausea and vomiting.   Genitourinary: Negative for dysuria, frequency, hematuria and urgency.   Musculoskeletal: Negative for falls, myalgias and neck pain.   Skin: Negative for itching and rash.   Neurological: Negative for dizziness, speech change, weakness and headaches.   Psychiatric/Behavioral: Negative for depression, memory loss and suicidal ideas. The patient is not nervous/anxious and does not have insomnia.        Physical Exam:      Vitals:    07/21/19 1400   BP: 132/78   BP Site: Right arm   Patient Position: Sitting   Cuff Size: Medium   Pulse: 96   Resp: 18   Temp: 98.4 F (36.9 C)   TempSrc: Temporal   SpO2: 95%   Height: 1.626 m (5' 4.02")       Physical Exam  Vitals reviewed.   Constitutional:       Appearance: Normal appearance. She is well-developed. She is not ill-appearing or diaphoretic.   HENT:      Head: Normocephalic and atraumatic.      Mouth/Throat:      Mouth: Mucous membranes are moist.      Pharynx: Oropharynx is clear.   Eyes:      Pupils: Pupils are equal, round, and reactive to light.   Neck:      Vascular: No carotid bruit.   Cardiovascular:      Rate and Rhythm: Normal rate and regular rhythm.      Heart sounds: S1 normal and S2 normal. No murmur. No friction rub. No gallop.    Pulmonary:      Effort: Pulmonary effort is normal.      Breath sounds: Normal breath sounds.   Abdominal:      General: Bowel sounds are normal.      Palpations: Abdomen is soft.      Tenderness: There is no abdominal tenderness.   Musculoskeletal:      Cervical back: Normal range of motion and neck supple.   Skin:     General: Skin is warm and dry.   Neurological:      General: No focal deficit present.      Mental Status: She is alert and oriented to person, place, and time.   Psychiatric:         Thought Content: Thought content normal.            Assessment:      Encounter Diagnosis    Name Primary?  Rib pain on left side           Plan:     Patient Instructions   When you establish with a new primary care provider, please let them know about our office so they can reach out to obtain your past medical records! Our office number is [phone] and our office fax number is [phone]!     If your medications need to be sent to a new pharmacy, please contact my office so we can send your medications to the appropriate pharmacy for refills until you are able to establish with a new PCP.        Simple Ways to Avoid COVID-19  The COVID-19 pandemic is sweeping the world. We all want to avoid this dangerous virus. Let's look at some simple ways to lower your risk of infection.  Wash your hands!  First, we know that washing hands is one of the best ways to keep this virus from spreading. So wash your hands with soap and water, and wash them often.  Lather for at least 20 seconds. Always wash your hands after you go to the bathroom. Wash them after you blow your nose. Wash them after sneezing or coughing. Wash them before you prepare food. And wash them before you eat. If you don't have a place to wash your hands, hand sanitizer is an option. Use one that contains at least 60 percent alcohol.  Cover coughs and sneezes!  Have the people around you cover their coughs and sneezes. Make sure they cough or sneeze into a tissue, and then throw it in the trash. If they don't have a tissue, they should cough or sneeze into their elbow.  Don't touch your face!  Next, try to avoid touching your face. Why? Because when you touch your face, you can move the virus from your hands to your eyes, nose or mouth. These are all routes the virus uses to get into your body. So even though it's not easy, try to keep your hands away from your face.  Avoid others, as much as possible!  And finally, stay away from others. Avoid groups of people. When you need to be in public, keep your distance from others. And if you do  feel sick, stay home. We know the virus spreads from person to person, so staying away from others keeps [place] all safer.      I highly recommend that you receive the COVID-19 vaccine as soon as it becomes available to you!     [facility] is now filling vaccine clinic appointments using the pre-registration list from the [place] [employer]. This will allow [place] to more fairly work from the wait list and will largely eliminate our need to post links.    If you have not yet pre-registered for the COVID-19 vaccine through the [facility], please do so by visiting [email or web address]. Those without internet should call 1-877-Vax-In-[place] ([phone]) to pre-register. Pre-registration does NOT mean you have an appointment.    [facility] is now administering vaccines in our area! You can sign up for an appointment at: [email or web address]. Please do not contact your local [facility] directly with questions about the COVID-19 vaccine; individual stores cannot schedule appointments or answer questions about eligibility. Appointments are being booked online only.    [facility] is now administering vaccines in our area! You can check your availability and sign up at: [email or web address]. If you do not have access to Internet you can call 1-800-[facility]OP[facility] (2698189509) Monday-Friday 8  AM - 10 PM ET, Saturday & Sununday 10:00 AM - 6:30 PM ET. Closed major holidays.    To learn more about the COVID-19 vaccine, find out if you qualify for a vaccine at this time, or schedule a vaccine visit, please visit [facility] website for vaccination administration at:  [email or web address]    For answers to frequently asked questions, please visit [email or web address]       Continue all medications as prescribed.     Return if symptoms worsen or fail to improve.    By signing my name below, I, Dorise Hiss, attest that this documentation  has been prepared under the direction and in the presence of Beverely Pace, DNP.  Electronically Signed: Dorise Hiss Scribe. 07/21/2019 4:51 PM     I, Beverely Pace, DNP , personally performed the services described in this documentation. All medical record entries made by the scribe were at my direction and in my presence. I have reviewed the chart and plan instructions and agree that the record reflects my personal performance and is accurate and complete.   07/21/2019 4:51 PM

## 2019-09-01 ENCOUNTER — Other Ambulatory Visit (RURAL_HEALTH_CENTER): Payer: Self-pay | Admitting: Family

## 2019-09-01 MED ORDER — VENLAFAXINE HCL 75 MG PO TABS
75.0000 mg | ORAL_TABLET | Freq: Two times a day (BID) | ORAL | 0 refills | Status: DC
Start: 2019-09-01 — End: 2019-09-01

## 2019-09-01 MED ORDER — VENLAFAXINE HCL 75 MG PO TABS
75.00 mg | ORAL_TABLET | Freq: Two times a day (BID) | ORAL | 0 refills | Status: AC
Start: 2019-09-01 — End: ?

## 2019-09-01 NOTE — Telephone Encounter (Signed)
Call from patient that has moved to [place], she has not established care with anyone yet and needs a refill of effexor. Her new pharmacy in [place] I have added in.

## 2019-09-04 ENCOUNTER — Other Ambulatory Visit (RURAL_HEALTH_CENTER): Payer: Self-pay | Admitting: Family

## 2019-09-04 MED ORDER — LOVASTATIN 20 MG PO TABS
ORAL_TABLET | ORAL | 0 refills | Status: AC
Start: 2019-09-04 — End: ?

## 2019-09-25 ENCOUNTER — Other Ambulatory Visit (RURAL_HEALTH_CENTER): Payer: Self-pay | Admitting: Family

## 2019-10-21 ENCOUNTER — Telehealth (RURAL_HEALTH_CENTER): Payer: Self-pay | Admitting: Family

## 2019-10-21 NOTE — Telephone Encounter (Signed)
Pt called in stating she has moved and has an est care visit scheduled with a new pcp in [place]. Requesting a refill of her bone pill (doesn't know the name of it) states she takes it just once a month. [phone]  [address]  [place], [place] [address]  Thanks, Carollee Herter

## 2019-10-21 NOTE — Telephone Encounter (Signed)
Pt calling are you willing to fill?

## 2019-10-21 NOTE — Telephone Encounter (Signed)
Pt has been made aware. 

## 2019-10-21 NOTE — Telephone Encounter (Signed)
No

## 2019-11-27 ENCOUNTER — Ambulatory Visit (RURAL_HEALTH_CENTER): Payer: Self-pay | Admitting: Family

## 2019-12-03 ENCOUNTER — Other Ambulatory Visit (RURAL_HEALTH_CENTER): Payer: Self-pay | Admitting: Family

## 2019-12-31 ENCOUNTER — Other Ambulatory Visit (RURAL_HEALTH_CENTER): Payer: Self-pay | Admitting: Family

## 2020-04-21 ENCOUNTER — Emergency Department (HOSPITAL_BASED_OUTPATIENT_CLINIC_OR_DEPARTMENT_OTHER): Payer: Medicare Other

## 2020-04-21 ENCOUNTER — Emergency Department (INDEPENDENT_AMBULATORY_CARE_PROVIDER_SITE_OTHER)
Admission: EM | Admit: 2020-04-21 | Discharge: 2020-04-21 | Disposition: A | Discharge status: Home / Self Care | Payer: Medicare Other | Source: Home / Self Care

## 2020-04-21 ENCOUNTER — Other Ambulatory Visit: Payer: Self-pay

## 2020-04-21 ENCOUNTER — Emergency Department (HOSPITAL_BASED_OUTPATIENT_CLINIC_OR_DEPARTMENT_OTHER)
Admission: EM | Admit: 2020-04-21 | Discharge: 2020-04-21 | Disposition: A | Discharge status: Home / Self Care | Payer: Medicare Other | Attending: Emergency Medicine | Admitting: Emergency Medicine

## 2020-04-21 ENCOUNTER — Encounter (HOSPITAL_BASED_OUTPATIENT_CLINIC_OR_DEPARTMENT_OTHER): Payer: Self-pay

## 2020-04-21 DIAGNOSIS — Y92009 Unspecified place in unspecified non-institutional (private) residence as the place of occurrence of the external cause: Secondary | ICD-10-CM | POA: Diagnosis not present

## 2020-04-21 DIAGNOSIS — S0181XA Laceration without foreign body of other part of head, initial encounter: Secondary | ICD-10-CM | POA: Diagnosis not present

## 2020-04-21 DIAGNOSIS — W010XXA Fall on same level from slipping, tripping and stumbling without subsequent striking against object, initial encounter: Secondary | ICD-10-CM | POA: Insufficient documentation

## 2020-04-21 DIAGNOSIS — S0990XA Unspecified injury of head, initial encounter: Secondary | ICD-10-CM | POA: Diagnosis present

## 2020-04-21 DIAGNOSIS — Z23 Encounter for immunization: Secondary | ICD-10-CM | POA: Diagnosis not present

## 2020-04-21 DIAGNOSIS — I10 Essential (primary) hypertension: Secondary | ICD-10-CM | POA: Insufficient documentation

## 2020-04-21 HISTORY — DX: Gastro-esophageal reflux disease without esophagitis: K21.9

## 2020-04-21 HISTORY — DX: Disorder of thyroid, unspecified: E07.9

## 2020-04-21 HISTORY — DX: Restless legs syndrome: G25.81

## 2020-04-21 HISTORY — DX: Essential (primary) hypertension: I10

## 2020-04-21 HISTORY — DX: Hypokalemia: E87.6

## 2020-04-21 MED ORDER — HYDROCODONE-ACETAMINOPHEN 5-325 MG PO TABS
1.0000 | ORAL_TABLET | Freq: Once | ORAL | Status: AC
  Administered 2020-04-21: 1 via ORAL
  Filled 2020-04-21: qty 1

## 2020-04-21 MED ORDER — ONDANSETRON 4 MG PO TBDP
4.0000 mg | ORAL_TABLET | Freq: Once | ORAL | Status: AC
  Administered 2020-04-21: 4 mg via ORAL
  Filled 2020-04-21: qty 1

## 2020-04-21 MED ORDER — TETANUS-DIPHTH-ACELL PERTUSSIS 5-2.5-18.5 LF-MCG/0.5 IM SUSY
0.5000 mL | PREFILLED_SYRINGE | Freq: Once | INTRAMUSCULAR | Status: AC
  Administered 2020-04-21: 0.5 mL via INTRAMUSCULAR
  Filled 2020-04-21: qty 0.5

## 2020-04-21 MED ORDER — BACITRACIN ZINC 500 UNIT/GM EX OINT
TOPICAL_OINTMENT | Freq: Once | CUTANEOUS | Status: AC
  Filled 2020-04-21: qty 28.35

## 2020-04-21 MED ORDER — LIDOCAINE-EPINEPHRINE (PF) 2 %-1:200000 IJ SOLN
10.0000 mL | Freq: Once | INTRAMUSCULAR | Status: AC
  Administered 2020-04-21: 10 mL via INTRADERMAL
  Filled 2020-04-21: qty 20

## 2020-04-21 NOTE — ED Provider Notes (Signed)
[facility]    CSN: [id number] Arrival date & time: 04/21/20  1929      History   Chief Complaint Chief Complaint  Patient presents with  . Head Laceration    HPI Claire Holmes is a 85 y.o. female.   HPI  Claire Holmes is a 85 y.o. female presenting to UC with husband with c/o deep laceration above her Left eye that occurred about 2.5 hours ago after tripping and falling, hitting her head on a piece of wood at a construction site where a house is being built.  Bleeding controlled PTA.  Pt went to [facility] Emergency Department prior to [facility], waited about 2 hours and was told it would be another 7 hour wait. A family member suggested they try [facility]. Pt denies LOC, HA, dizziness, change in vision, nausea, mouth pain or neck pain. She is not on blood thinners.   History reviewed. No pertinent past medical history.  There are no problems to display for this patient.   History reviewed. No pertinent surgical history.  OB History   No obstetric history on file.      Home Medications    Prior to Admission medications   Not on File    Family History Family History  Problem Relation Age of Onset  . Asthma Mother   . Cancer Mother   . Alzheimer's disease Father     Social History Social History   Tobacco Use  . Smoking status: Never Smoker  . Smokeless tobacco: Never Used  Vaping Use  . Vaping Use: Never used  Substance Use Topics  . Alcohol use: Not Currently     Allergies   Patient has no known allergies.   Review of Systems Review of Systems  Eyes: Negative for photophobia and visual disturbance.  Gastrointestinal: Negative for nausea and vomiting.  Musculoskeletal: Negative for back pain and neck pain.  Skin: Positive for wound.  Neurological: Negative for dizziness, weakness, light-headedness, numbness and headaches.     Physical Exam Triage Vital Signs ED Triage Vitals  Enc Vitals Group     BP 04/21/20 1943 (!) 158/87      Pulse Rate 04/21/20 1943 68     Resp 04/21/20 1943 16     Temp 04/21/20 1943 98 F (36.7 C)     Temp Source 04/21/20 1943 Oral     SpO2 04/21/20 1943 98 %     Weight --      Height --      Head Circumference --      Peak Flow --      Pain Score 04/21/20 1939 3     Pain Loc --      Pain Edu? --      Excl. in GC? --    No data found.  Updated Vital Signs BP (!) 158/87 (BP Location: Left Arm)   Pulse 68   Temp 98 F (36.7 C) (Oral)   Resp 16   SpO2 98%   Visual Acuity Right Eye Distance:   Left Eye Distance:   Bilateral Distance:    Right Eye Near:   Left Eye Near:    Bilateral Near:     Physical Exam Vitals and nursing note reviewed.  Constitutional:      General: She is not in acute distress.    Appearance: Normal appearance. She is well-developed and well-nourished. She is not ill-appearing, toxic-appearing or diaphoretic.  HENT:     Head: Normocephalic. Abrasion and laceration present.  Jaw: There is normal jaw occlusion. No trismus, tenderness, swelling, pain on movement or malocclusion.      Right Ear: Tympanic membrane and ear canal normal.     Left Ear: Tympanic membrane and ear canal normal.     Nose:     Right Sinus: No maxillary sinus tenderness or frontal sinus tenderness.     Left Sinus: Frontal sinus tenderness present. No maxillary sinus tenderness.     Mouth/Throat:     Lips: Pink.     Mouth: Mucous membranes are moist.     Pharynx: Oropharynx is clear. Uvula midline.  Eyes:     Extraocular Movements: Extraocular movements intact and EOM normal.     Conjunctiva/sclera: Conjunctivae normal.     Pupils: Pupils are equal, round, and reactive to light.  Cardiovascular:     Rate and Rhythm: Normal rate.  Pulmonary:     Effort: Pulmonary effort is normal. No respiratory distress.  Musculoskeletal:        General: Normal range of motion.     Cervical back: Normal range of motion and neck supple. No rigidity or tenderness.  Skin:    General:  Skin is warm and dry.  Neurological:     General: No focal deficit present.     Mental Status: She is alert and oriented to person, place, and time.  Psychiatric:        Mood and Affect: Mood and affect and mood normal.        Behavior: Behavior normal.      UC Treatments / Results  Labs (all labs ordered are listed, but only abnormal results are displayed) Labs Reviewed - No data to display  EKG   Radiology No results found.  Procedures Procedures (including critical care time)  Medications Ordered in UC Medications - No data to display  Initial Impression / Assessment and Plan / UC Course  I have reviewed the triage vital signs and the nursing notes.  Pertinent labs & imaging results that were available during my care of the patient were reviewed by me and considered in my medical decision making (see chart for details).    Given pt's age and depth of head laceration, recommend further evaluation and treatment in emergency department. Advised a CT head is recommended  Pt states she will "NOT" stay overnight anywhere.   Pt agreeable to have her husband drive her to [facility], understanding a transfer would be needed if she needed admission. Pt discharged in stable condition. Able to ambulate without assistance, accompanied by her husband.   Final Clinical Impressions(s) / UC Diagnoses   Final diagnoses:  Facial laceration, initial encounter  Injury of head, initial encounter  Fall from slip, trip, or stumble, initial encounter     Discharge Instructions      Due to the severity of your head wound, it is advised you have your husband drive you to the emergency department for further evaluation and treatment.     ED Prescriptions    None     PDMP not reviewed this encounter.   Lurene Shadow, [place] 04/21/20 2009

## 2020-04-21 NOTE — ED Notes (Signed)
Patient is being discharged from the Urgent Care and sent to the Emergency Department via POV driven by significant other . Per Denny Peon, [place], patient is in need of higher level of care due to head injury in need of sutures and need for CT scan of the head. Patient is aware and verbalizes understanding of plan of care.  Vitals:   04/21/20 1943  BP: (!) 158/87  Pulse: 68  Resp: 16  Temp: 98 F (36.7 C)  SpO2: 98%

## 2020-04-21 NOTE — Discharge Instructions (Signed)
  Due to the severity of your head wound, it is advised you have your husband drive you to the emergency department for further evaluation and treatment.

## 2020-04-21 NOTE — ED Provider Notes (Signed)
[facility] EMERGENCY DEPARTMENT Provider Note   CSN: [id number] Arrival date & time: 04/21/20  2033     History Chief Complaint  Patient presents with  . Fall    Head injury     Claire Holmes is a 85 y.o. female possible history of GERD, hypertension who presents for evaluation of fall, facial laceration.  Patient reports that she was looking at a house and states that it is under renovation and she tripped over a pole, causing her to fall and have a laceration noted to her left eyebrow.  She states no LOC.  She is not on blood thinners..  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  She reports some pain around her eye but has not had any vision changes.  She she also reports some pain to the left side of her neck.  She denies any nausea/vomiting, numbness/weakness, vision changes, difficulty ambulating. She does not know when her last tetanus was.   The history is provided by the patient.       Past Medical History:  Diagnosis Date  . GERD (gastroesophageal reflux disease)   . Hypertension   . Low blood potassium   . RLS (restless legs syndrome)   . Thyroid disease     There are no problems to display for this patient.   Past Surgical History:  Procedure Laterality Date  . ABDOMINAL HYSTERECTOMY    . ANKLE FRACTURE SURGERY    . CHOLECYSTECTOMY       OB History   No obstetric history on file.     Family History  Problem Relation Age of Onset  . Asthma Mother   . Cancer Mother   . Alzheimer's disease Father     Social History   Tobacco Use  . Smoking status: Never Smoker  . Smokeless tobacco: Never Used  Vaping Use  . Vaping Use: Never used  Substance Use Topics  . Alcohol use: Not Currently  . Drug use: Never    Home Medications Prior to Admission medications   Not on File    Allergies    Patient has no known allergies.  Review of Systems   Review of Systems  Eyes: Negative for visual disturbance.  Skin: Positive for wound.   Neurological: Negative for weakness, numbness and headaches.  All other systems reviewed and are negative.   Physical Exam Updated Vital Signs BP (!) 156/76 (BP Location: Right Arm)   Pulse 67   Temp 97.9 F (36.6 C) (Oral)   Resp 14   Ht 5\' 3"  (1.6 m)   Wt 68 kg   SpO2 100%   BMI 26.57 kg/m   Physical Exam Vitals and nursing note reviewed.  Constitutional:      Appearance: She is well-developed and well-nourished.  HENT:     Head: Normocephalic.     Comments: 2.5 cm laceration noted through the left eyebrow that extends to the lateral end of the eyebrow.  Does not involve the upper eyelid.  No other tenderness noted head, underlying skull deformity or crepitus noted. Eyes:     General: No scleral icterus.       Right eye: No discharge.        Left eye: No discharge.     Extraocular Movements: EOM normal.     Conjunctiva/sclera: Conjunctivae normal.     Comments: PERRL. EOMs intact through all range of motion. No nystagmus. No neglect.  Mild tenderness noted to the left superior orbit.  No underlying deformity  or crepitus noted.  Neck:      Comments: Mild tenderness noted to the lateral left paraspinal muscles of the cervical region that extend down into the left trapezius.  No midline tenderness or deformity or crepitus noted. Pulmonary:     Effort: Pulmonary effort is normal.  Skin:    General: Skin is warm and dry.  Neurological:     Mental Status: She is alert.     Comments: Cranial nerves III-XII intact Follows commands, Moves all extremities  5/5 strength to BUE and BLE  Sensation intact throughout all major nerve distributions No slurred speech. No facial droop.   Psychiatric:        Mood and Affect: Mood and affect normal.        Speech: Speech normal.        Behavior: Behavior normal.     ED Results / Procedures / Treatments   Labs (all labs ordered are listed, but only abnormal results are displayed) Labs Reviewed - No data to  display  EKG None  Radiology CT Head Wo Contrast  Result Date: 04/21/2020 CLINICAL DATA:  Head trauma. Fall today with laceration above the left eye. EXAM: CT HEAD WITHOUT CONTRAST TECHNIQUE: Contiguous axial images were obtained from the base of the skull through the vertex without intravenous contrast. COMPARISON:  None. FINDINGS: Brain: There is no evidence of an acute infarct, intracranial hemorrhage, mass, midline shift, or extra-axial fluid collection. Patchy to confluent hypodensities in the cerebral white matter bilaterally are nonspecific but compatible with moderate chronic small vessel ischemic disease. There is mild to moderate central predominant cerebral atrophy. Vascular: Calcified atherosclerosis at the skull base. No hyperdense vessel. Skull: No fracture or suspicious osseous lesion. Sinuses/Orbits: Minimal mucosal thickening in the paranasal sinuses. Clear mastoid air cells. Unremarkable orbits. Other: Left supraorbital scalp laceration. IMPRESSION: 1. No evidence of acute intracranial abnormality. 2. Moderate chronic small vessel ischemic disease. 3. Left supraorbital scalp laceration. Electronically Signed   By: Sebastian Ache M.D.   On: 04/21/2020 21:35   CT Maxillofacial Wo Contrast  Result Date: 04/21/2020 CLINICAL DATA:  Tripped and fell, left supraorbital laceration EXAM: CT MAXILLOFACIAL WITHOUT CONTRAST TECHNIQUE: Multidetector CT imaging of the maxillofacial structures was performed. Multiplanar CT image reconstructions were also generated. COMPARISON:  None. FINDINGS: Osseous: No fracture or mandibular dislocation. No destructive process. Orbits: Negative. No traumatic or inflammatory finding. Sinuses: Clear. Soft tissues: Left supraorbital scalp laceration. Remaining soft tissues are unremarkable. Limited intracranial: No significant or unexpected finding. IMPRESSION: 1. Left supraorbital scalp laceration.  No acute fracture. Electronically Signed   By: Sharlet Salina M.D.    On: 04/21/2020 22:01    Procedures .Marland KitchenLaceration Repair  Date/Time: 04/21/2020 11:08 PM Performed by: Maxwell Caul, PA-C Authorized by: Maxwell Caul, PA-C   Consent:    Consent obtained:  Verbal   Consent given by:  Patient   Risks discussed:  Infection, need for additional repair, pain, poor cosmetic result and poor wound healing   Alternatives discussed:  No treatment and delayed treatment Universal protocol:    Procedure explained and questions answered to patient or proxy's satisfaction: yes     Relevant documents present and verified: yes     Test results available: yes     Imaging studies available: yes     Required blood products, implants, devices, and special equipment available: yes     Site/side marked: yes     Immediately prior to procedure, a time out was called: yes  Patient identity confirmed:  Verbally with patient Anesthesia:    Anesthesia method:  Local infiltration Laceration details:    Location:  Face   Face location:  L eyebrow   Length (cm):  2.5 Pre-procedure details:    Preparation:  Patient was prepped and draped in usual sterile fashion Treatment:    Area cleansed with:  Saline   Amount of cleaning:  Standard   Irrigation solution:  Sterile saline   Irrigation method:  Syringe   Visualized foreign bodies/material removed: no     Layers/structures repaired:  Deep subcutaneous Deep subcutaneous:    Suture size:  5-0   Suture material:  Vicryl   Suture technique:  Simple interrupted   Number of sutures:  1 Skin repair:    Repair method:  Sutures   Suture size:  5-0   Suture material:  Prolene   Suture technique:  Simple interrupted   Number of sutures:  5 Approximation:    Approximation:  Close Repair type:    Repair type:  Intermediate Post-procedure details:    Dressing:  Antibiotic ointment and non-adherent dressing   Procedure completion:  Tolerated Comments:     When the laceration was anesthetized, was irrigated.  She  had some small amount of eyebrow hairs noted that were removed.  No other foreign body noted.  The muscle appeared to be intact.  No defect of the muscle.  The area was examined through full range of motion of the eye with no abnormalities.  The subcutaneous level was closed in the middle with 1 Vicryl suture and then 5 Prolene sutures were placed on the skin.  Reevaluation after showed good EOMs without any difficulty.     Medications Ordered in ED Medications  lidocaine-EPINEPHrine (XYLOCAINE W/EPI) 2 %-1:200000 (PF) injection 10 mL (10 mLs Intradermal Given by Other 04/21/20 2203)  HYDROcodone-acetaminophen (NORCO/VICODIN) 5-325 MG per tablet 1 tablet (1 tablet Oral Given 04/21/20 2215)  ondansetron (ZOFRAN-ODT) disintegrating tablet 4 mg (4 mg Oral Given 04/21/20 2215)  bacitracin ointment ( Topical Given 04/21/20 2312)  Tdap (BOOSTRIX) injection 0.5 mL (0.5 mLs Intramuscular Given 04/21/20 2312)    ED Course  I have reviewed the triage vital signs and the nursing notes.  Pertinent labs & imaging results that were available during my care of the patient were reviewed by me and considered in my medical decision making (see chart for details).    MDM Rules/Calculators/A&P                          85 year old female who presents for evaluation of facial laceration that occurred earlier's evening.  She reports tripping over a pole and hitting her face.  No LOC.  She is not on blood thinners.  She reports a laceration to her left eyebrow.  No vision changes, numbness/weakness.  On initial arrival, she is afebrile, nontoxic-appearing.  Vital signs are stable.  She is a 2.5 cm laceration noted through the left eyebrow.  It does not involve the upper eyelid.  She has some surrounding superior periorbital tenderness.  No deformity or crepitus noted.  EOMs intact with any difficulty.  No neuro deficits.  We will plan for imaging of her head maxillofacial given orbital tenderness.  Plan for wound care,  tetanus.  CT head negative for any acute bony abnormality.  No intracranial hemorrhage.  CT maxillofacial negative for any acute bony abnormality.  Laceration repaired as documented above.  Patient tolerate procedure well.  Encouraged at home supportive care measures. At this time, patient exhibits no emergent life-threatening condition that require further evaluation in ED. Patient had ample opportunity for questions and discussion. All patient's questions were answered with full understanding. Strict return precautions discussed. Patient expresses understanding and agreement to plan.   Portions of this note were generated with Scientist, clinical (histocompatibility and immunogenetics). Dictation errors may occur despite best attempts at proofreading.  Final Clinical Impression(s) / ED Diagnoses Final diagnoses:  Facial laceration, initial encounter    Rx / DC Orders ED Discharge Orders    None       Rosana Hoes 04/21/20 2349    Milagros Loll, MD 04/23/20 2355

## 2020-04-21 NOTE — Discharge Instructions (Addendum)
Keep the wound clean and dry for the first 24 hours. After that you may gently clean the wound with soap and water. Make sure to pat dry the wound before covering it with any dressing. You can use topical antibiotic ointment and bandage. Ice and elevate for pain relief.  ° °You can take Tylenol or Ibuprofen as directed for pain. You can alternate Tylenol and Ibuprofen every 4 hours for additional pain relief.  ° °Return to the Emergency Department, your primary care doctor, or the [facility] in 5-7 days for suture removal.  ° °Monitor closely for any signs of infection. Return to the Emergency Department for any worsening redness/swelling of the area that begins to spread, drainage from the site, worsening pain, fever or any other worsening or concerning symptoms.  ° ° °

## 2020-04-21 NOTE — ED Notes (Signed)
ED Provider at bedside. 

## 2020-04-21 NOTE — ED Triage Notes (Addendum)
Pt reports trip/fall ~5pm-deep lac noted above left eyebrow-pt denies LOC-no blood thinners-pt LWBS at [facility] ED-seen at [facility] and sent here-pt NAD-steady gait

## 2020-04-21 NOTE — ED Triage Notes (Signed)
Patient presents to Urgent Care with complaints of laceration to left eyebrow since about 2 and a half hours ago. Patient reports they went to the ED first and sat for 2 hours before they were told there would be a 7 hour wait. Pt is A&Ox4 upon arrival, ambulatory with steady gait. Pt denies LOC after/ during fall.

## 2020-04-29 ENCOUNTER — Emergency Department
Admission: EM | Admit: 2020-04-29 | Discharge: 2020-04-29 | Disposition: A | Discharge status: Home / Self Care | Payer: Medicare Other | Source: Home / Self Care

## 2020-04-29 ENCOUNTER — Other Ambulatory Visit: Payer: Self-pay

## 2020-04-29 NOTE — ED Triage Notes (Signed)
Patient presents to Urgent Care with complaints of needing 6 sutures removed from her left eyebrow. Wound healing well, sutures removed without difficulty. Wound care education provided.

## 2022-04-21 IMAGING — CT CT HEAD W/O CM
3 series · 16 of 47 positions shown, 19 images · non-contrast
Comparison: None.

CLINICAL DATA: Head trauma. Fall today with laceration above the
left eye.

EXAM:
CT HEAD WITHOUT CONTRAST
TECHNIQUE: Contiguous axial images were obtained from the base of the skull
through the vertex without intravenous contrast.

[Series 3: head wo · axial · 0.39mm/px · z∈[+748,+873]mm · 10 of 31 slices shown, 13 images]
[im 3/31  brain]
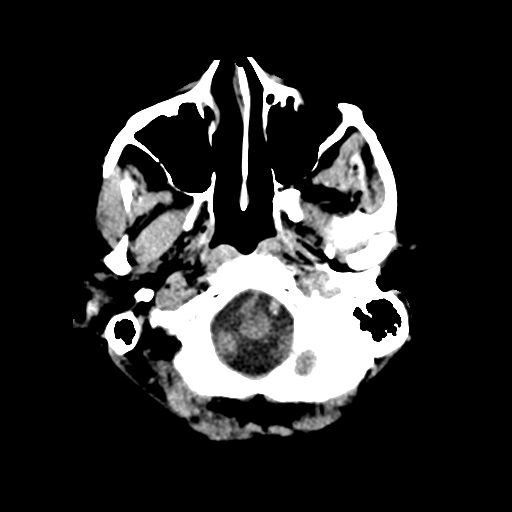
[im 3/31  bone]
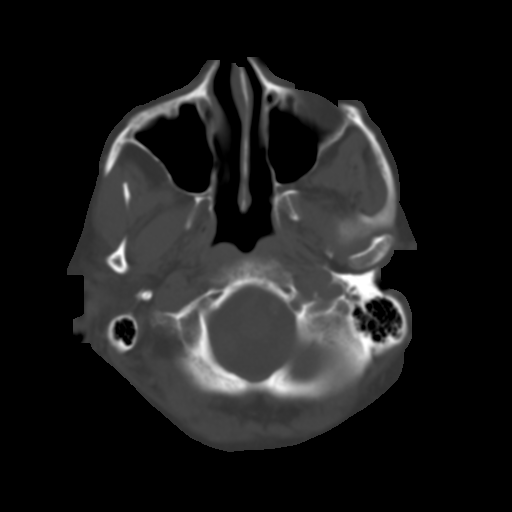
[im 6/31  brain]
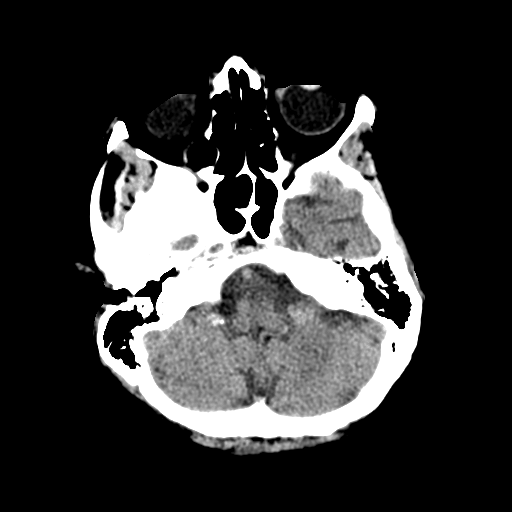
[im 9/31  brain]
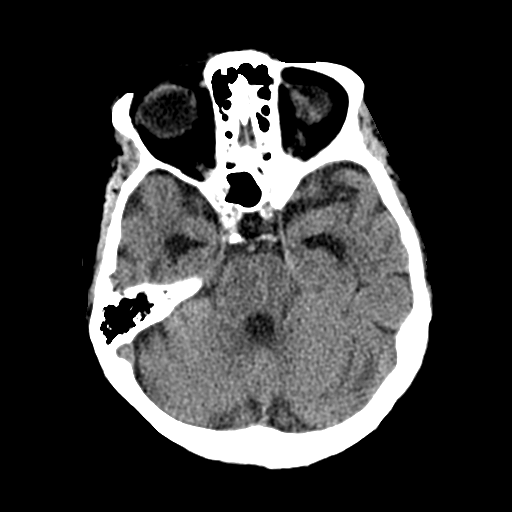
[im 11/31  brain]
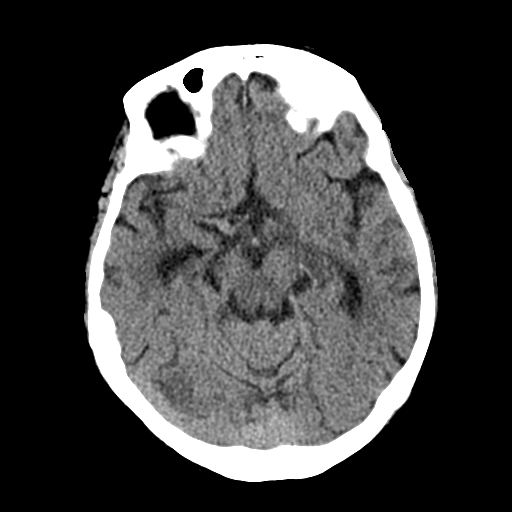
[im 14/31  brain]
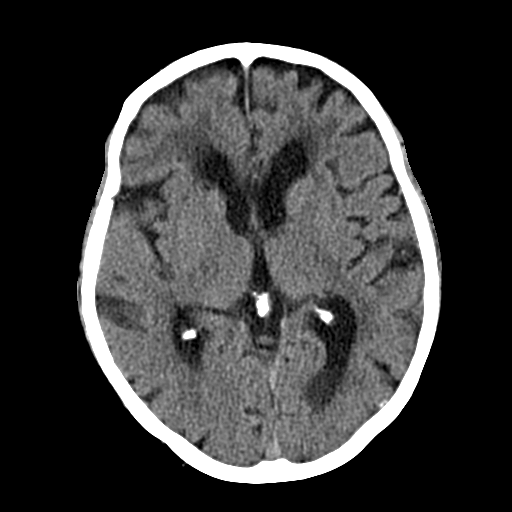
[im 14/31  bone]
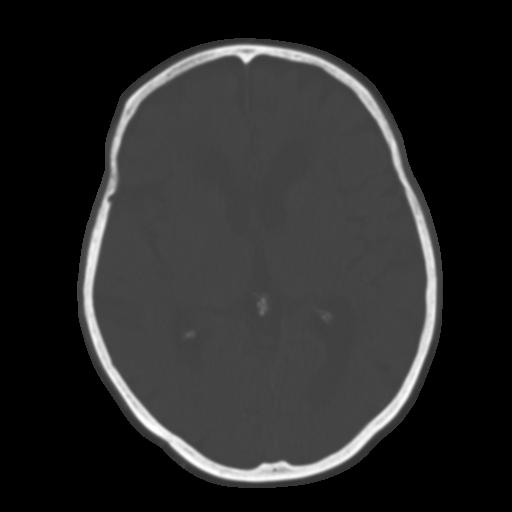
[im 17/31  brain]
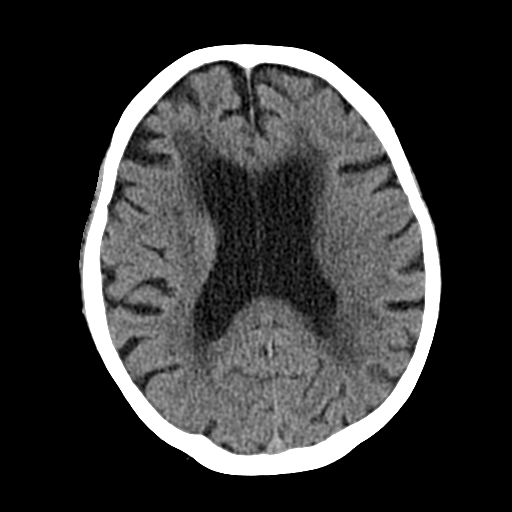
[im 20/31  brain]
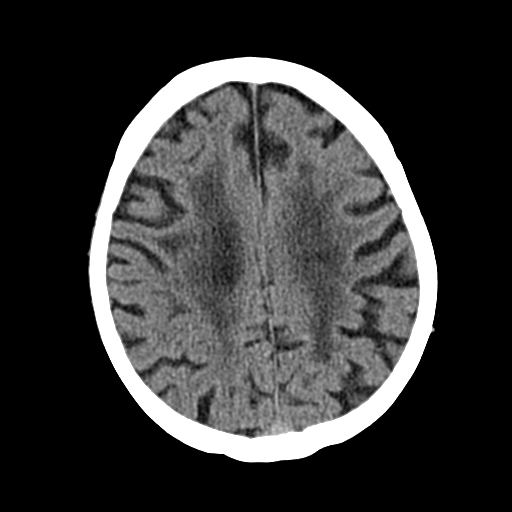
[im 23/31  brain]
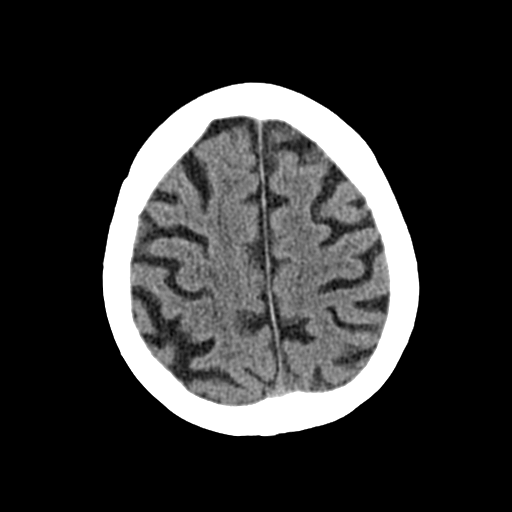
[im 25/31  brain]
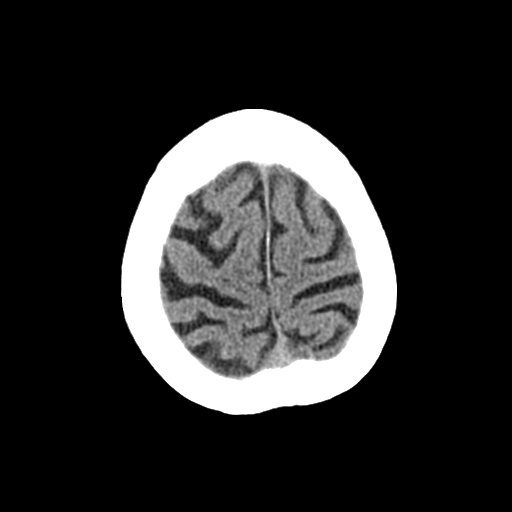
[im 25/31  bone]
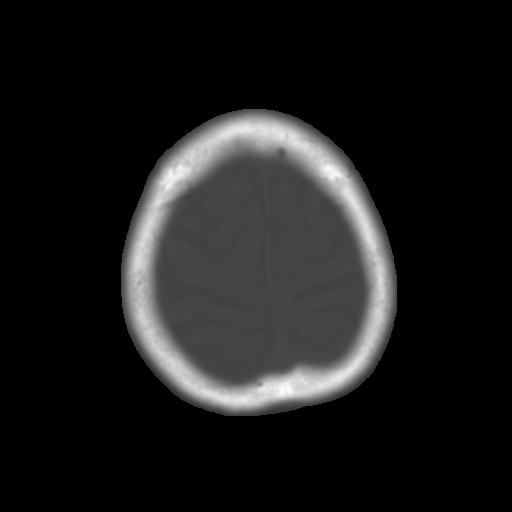
[im 28/31  brain]
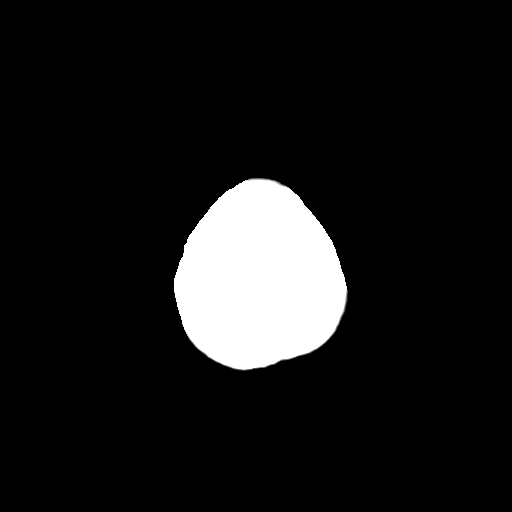

[Series 4: cor soft · coronal · 0.33mm/px · 3 of 58 slices shown]
[im 20/58  brain]
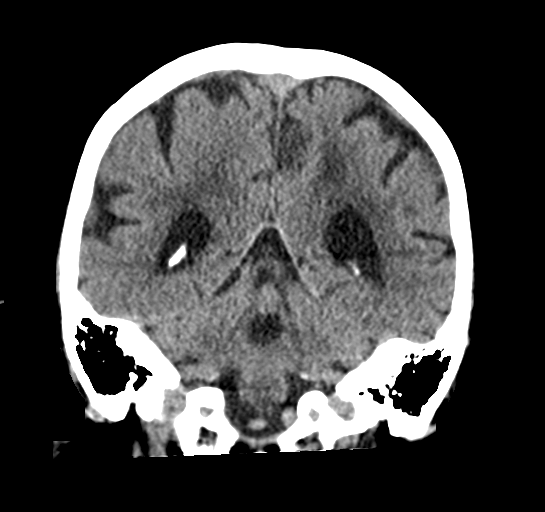
[im 26/58  brain]
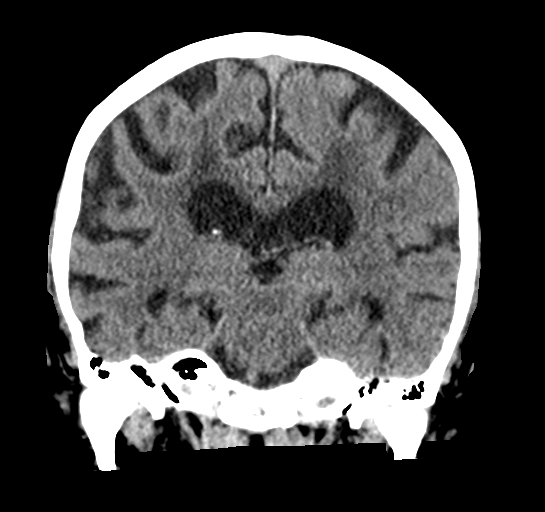
[im 32/58  brain]
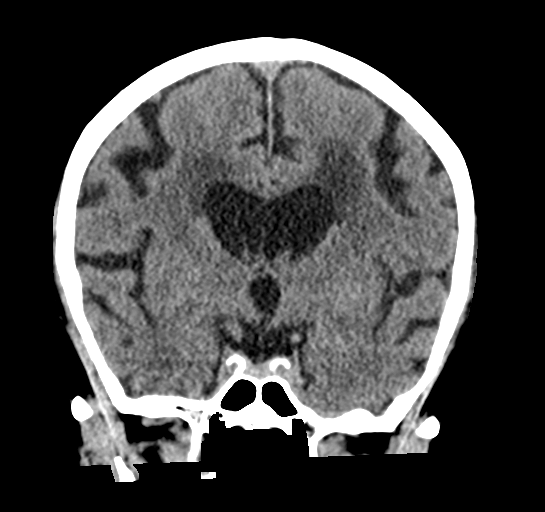

[Series 5: sag soft · sagittal · 0.33mm/px · 3 of 48 slices shown]
[im 16/48  brain]
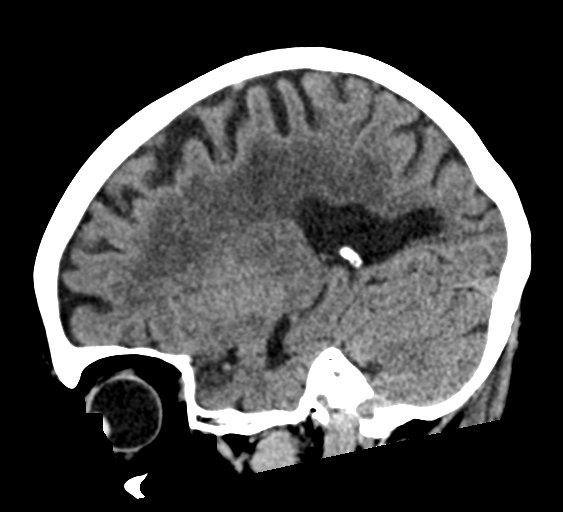
[im 24/48  brain]
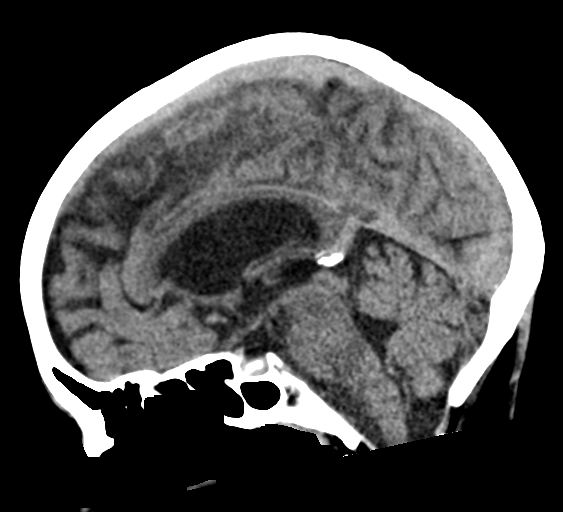
[im 32/48  brain]
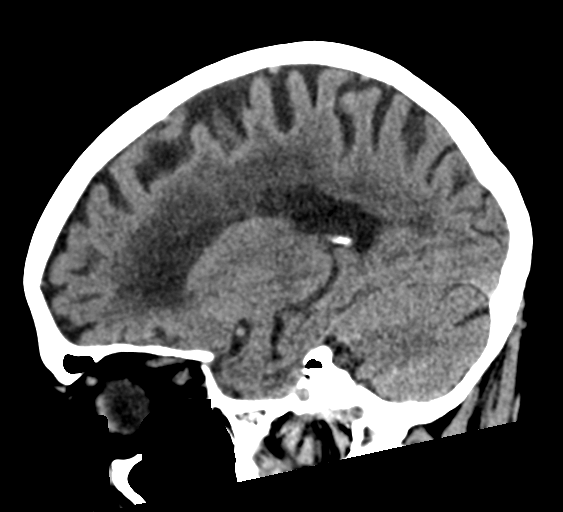

[16 of 47 positions shown; findings below may reference images not displayed]

FINDINGS: Brain: There is no evidence of an acute infarct, intracranial
hemorrhage, mass, midline shift, or extra-axial fluid collection.
Patchy to confluent hypodensities in the cerebral white matter
bilaterally are nonspecific but compatible with moderate chronic
small vessel ischemic disease. There is mild to moderate central
predominant cerebral atrophy.

Vascular: Calcified atherosclerosis at the skull base. No hyperdense
vessel.

Skull: No fracture or suspicious osseous lesion.

Sinuses/Orbits: Minimal mucosal thickening in the paranasal sinuses.
Clear mastoid air cells. Unremarkable orbits.

Other: Left supraorbital scalp laceration.
IMPRESSION: 1. No evidence of acute intracranial abnormality.
2. Moderate chronic small vessel ischemic disease.
3. Left supraorbital scalp laceration.
